# Patient Record
Sex: Female | Born: 1985 | Race: Black or African American | Hispanic: No | Marital: Single | Smoking: Current some day smoker
Health system: Southern US, Community
[De-identification: ages and names within clinical notes are randomized; demographics above are authoritative.]

## PROBLEM LIST (undated history)

## (undated) ENCOUNTER — Ambulatory Visit (HOSPITAL_COMMUNITY): Admission: EM | Payer: Medicare Other

## (undated) DIAGNOSIS — J40 Bronchitis, not specified as acute or chronic: Secondary | ICD-10-CM

## (undated) DIAGNOSIS — Q738 Other reduction defects of unspecified limb(s): Secondary | ICD-10-CM

## (undated) DIAGNOSIS — M1990 Unspecified osteoarthritis, unspecified site: Secondary | ICD-10-CM

## (undated) HISTORY — PX: HIP SURGERY: SHX245

## (undated) HISTORY — PX: VAGINA SURGERY: SHX829

## (undated) HISTORY — PX: OTHER SURGICAL HISTORY: SHX169

---
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
## 2003-09-12 ENCOUNTER — Emergency Department (HOSPITAL_COMMUNITY): Admission: EM | Admit: 2003-09-12 | Discharge: 2003-09-12 | Payer: Self-pay

## 2004-06-22 ENCOUNTER — Emergency Department (HOSPITAL_COMMUNITY): Admission: EM | Admit: 2004-06-22 | Discharge: 2004-06-22 | Payer: Self-pay | Admitting: Family Medicine

## 2004-06-25 ENCOUNTER — Emergency Department (HOSPITAL_COMMUNITY): Admission: EM | Admit: 2004-06-25 | Discharge: 2004-06-25 | Payer: Self-pay

## 2004-07-20 ENCOUNTER — Emergency Department (HOSPITAL_COMMUNITY): Admission: EM | Admit: 2004-07-20 | Discharge: 2004-07-20 | Payer: Self-pay | Admitting: Emergency Medicine

## 2004-08-08 ENCOUNTER — Emergency Department (HOSPITAL_COMMUNITY): Admission: EM | Admit: 2004-08-08 | Discharge: 2004-08-08 | Payer: Self-pay | Admitting: Family Medicine

## 2004-09-13 ENCOUNTER — Inpatient Hospital Stay (HOSPITAL_COMMUNITY): Admission: AD | Admit: 2004-09-13 | Discharge: 2004-09-19 | Payer: Self-pay | Admitting: Psychiatry

## 2004-09-13 ENCOUNTER — Ambulatory Visit: Payer: Self-pay | Admitting: Psychiatry

## 2004-10-26 ENCOUNTER — Emergency Department (HOSPITAL_COMMUNITY): Admission: EM | Admit: 2004-10-26 | Discharge: 2004-10-26 | Payer: Self-pay | Admitting: Emergency Medicine

## 2004-11-16 ENCOUNTER — Emergency Department (HOSPITAL_COMMUNITY): Admission: EM | Admit: 2004-11-16 | Discharge: 2004-11-16 | Payer: Self-pay | Admitting: Emergency Medicine

## 2005-02-04 ENCOUNTER — Emergency Department (HOSPITAL_COMMUNITY): Admission: EM | Admit: 2005-02-04 | Discharge: 2005-02-04 | Payer: Self-pay | Admitting: Emergency Medicine

## 2005-03-15 ENCOUNTER — Emergency Department (HOSPITAL_COMMUNITY): Admission: EM | Admit: 2005-03-15 | Discharge: 2005-03-15 | Payer: Self-pay | Admitting: Emergency Medicine

## 2005-03-25 ENCOUNTER — Emergency Department (HOSPITAL_COMMUNITY): Admission: EM | Admit: 2005-03-25 | Discharge: 2005-03-26 | Payer: Self-pay | Admitting: Emergency Medicine

## 2005-03-31 ENCOUNTER — Emergency Department (HOSPITAL_COMMUNITY): Admission: EM | Admit: 2005-03-31 | Discharge: 2005-03-31 | Payer: Self-pay | Admitting: Emergency Medicine
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
## 2005-04-10 ENCOUNTER — Emergency Department (HOSPITAL_COMMUNITY): Admission: EM | Admit: 2005-04-10 | Discharge: 2005-04-11 | Payer: Self-pay | Admitting: Emergency Medicine

## 2005-07-14 ENCOUNTER — Emergency Department (HOSPITAL_COMMUNITY): Admission: EM | Admit: 2005-07-14 | Discharge: 2005-07-14 | Payer: Self-pay | Admitting: Emergency Medicine

## 2005-07-22 ENCOUNTER — Emergency Department (HOSPITAL_COMMUNITY): Admission: EM | Admit: 2005-07-22 | Discharge: 2005-07-22 | Payer: Self-pay | Admitting: Emergency Medicine

## 2005-08-20 ENCOUNTER — Emergency Department (HOSPITAL_COMMUNITY): Admission: EM | Admit: 2005-08-20 | Discharge: 2005-08-20 | Payer: Self-pay | Admitting: Emergency Medicine

## 2005-08-26 ENCOUNTER — Encounter: Admission: RE | Admit: 2005-08-26 | Discharge: 2005-10-31 | Payer: Self-pay | Admitting: Orthopaedic Surgery

## 2005-08-27 ENCOUNTER — Emergency Department (HOSPITAL_COMMUNITY): Admission: EM | Admit: 2005-08-27 | Discharge: 2005-08-27 | Payer: Self-pay | Admitting: Emergency Medicine

## 2005-08-31 ENCOUNTER — Emergency Department (HOSPITAL_COMMUNITY): Admission: EM | Admit: 2005-08-31 | Discharge: 2005-08-31 | Payer: Self-pay | Admitting: Emergency Medicine

## 2005-09-02 ENCOUNTER — Inpatient Hospital Stay (HOSPITAL_COMMUNITY): Admission: RE | Admit: 2005-09-02 | Discharge: 2005-09-05 | Payer: Self-pay | Admitting: Psychiatry

## 2005-09-03 ENCOUNTER — Ambulatory Visit: Payer: Self-pay | Admitting: Psychiatry

## 2005-11-16 ENCOUNTER — Emergency Department (HOSPITAL_COMMUNITY): Admission: EM | Admit: 2005-11-16 | Discharge: 2005-11-16 | Payer: Self-pay | Admitting: *Deleted

## 2006-03-06 ENCOUNTER — Emergency Department (HOSPITAL_COMMUNITY): Admission: EM | Admit: 2006-03-06 | Discharge: 2006-03-06 | Payer: Self-pay | Admitting: Emergency Medicine

## 2006-03-09 ENCOUNTER — Inpatient Hospital Stay (HOSPITAL_COMMUNITY): Admission: AD | Admit: 2006-03-09 | Discharge: 2006-03-09 | Payer: Self-pay | Admitting: Obstetrics

## 2006-04-25 ENCOUNTER — Inpatient Hospital Stay (HOSPITAL_COMMUNITY): Admission: AD | Admit: 2006-04-25 | Discharge: 2006-04-25 | Payer: Self-pay | Admitting: Obstetrics

## 2006-05-11 ENCOUNTER — Emergency Department (HOSPITAL_COMMUNITY): Admission: EM | Admit: 2006-05-11 | Discharge: 2006-05-11 | Payer: Self-pay | Admitting: Emergency Medicine

## 2006-05-15 ENCOUNTER — Emergency Department (HOSPITAL_COMMUNITY): Admission: EM | Admit: 2006-05-15 | Discharge: 2006-05-15 | Payer: Self-pay | Admitting: Emergency Medicine

## 2006-07-04 ENCOUNTER — Inpatient Hospital Stay (HOSPITAL_COMMUNITY): Admission: AD | Admit: 2006-07-04 | Discharge: 2006-07-04 | Payer: Self-pay | Admitting: Obstetrics

## 2006-09-01 ENCOUNTER — Inpatient Hospital Stay (HOSPITAL_COMMUNITY): Admission: AD | Admit: 2006-09-01 | Discharge: 2006-09-01 | Payer: Self-pay | Admitting: Obstetrics

## 2006-09-05 ENCOUNTER — Inpatient Hospital Stay (HOSPITAL_COMMUNITY): Admission: AD | Admit: 2006-09-05 | Discharge: 2006-09-05 | Payer: Self-pay | Admitting: Obstetrics

## 2006-10-22 ENCOUNTER — Inpatient Hospital Stay (HOSPITAL_COMMUNITY): Admission: AD | Admit: 2006-10-22 | Discharge: 2006-10-22 | Payer: Self-pay | Admitting: Obstetrics

## 2006-10-28 ENCOUNTER — Inpatient Hospital Stay (HOSPITAL_COMMUNITY): Admission: AD | Admit: 2006-10-28 | Discharge: 2006-10-31 | Payer: Self-pay | Admitting: Obstetrics

## 2007-01-02 ENCOUNTER — Emergency Department (HOSPITAL_COMMUNITY): Admission: EM | Admit: 2007-01-02 | Discharge: 2007-01-02 | Payer: Self-pay | Admitting: Family Medicine

## 2007-01-10 ENCOUNTER — Emergency Department (HOSPITAL_COMMUNITY): Admission: EM | Admit: 2007-01-10 | Discharge: 2007-01-11 | Payer: Self-pay | Admitting: Emergency Medicine

## 2007-01-12 ENCOUNTER — Emergency Department (HOSPITAL_COMMUNITY): Admission: EM | Admit: 2007-01-12 | Discharge: 2007-01-12 | Payer: Self-pay | Admitting: Family Medicine

## 2007-03-13 ENCOUNTER — Inpatient Hospital Stay (HOSPITAL_COMMUNITY): Admission: AD | Admit: 2007-03-13 | Discharge: 2007-03-13 | Payer: Self-pay | Admitting: Obstetrics

## 2007-05-06 ENCOUNTER — Emergency Department (HOSPITAL_COMMUNITY): Admission: EM | Admit: 2007-05-06 | Discharge: 2007-05-06 | Payer: Self-pay | Admitting: Family Medicine

## 2007-08-07 ENCOUNTER — Emergency Department (HOSPITAL_COMMUNITY): Admission: EM | Admit: 2007-08-07 | Discharge: 2007-08-07 | Payer: Self-pay | Admitting: Emergency Medicine

## 2007-12-24 ENCOUNTER — Emergency Department (HOSPITAL_COMMUNITY): Admission: EM | Admit: 2007-12-24 | Discharge: 2007-12-24 | Payer: Self-pay | Admitting: Emergency Medicine

## 2007-12-26 ENCOUNTER — Emergency Department (HOSPITAL_COMMUNITY): Admission: EM | Admit: 2007-12-26 | Discharge: 2007-12-26 | Payer: Self-pay | Admitting: Emergency Medicine

## 2008-07-24 ENCOUNTER — Emergency Department (HOSPITAL_COMMUNITY): Admission: EM | Admit: 2008-07-24 | Discharge: 2008-07-24 | Payer: Self-pay | Admitting: Emergency Medicine

## 2008-08-16 ENCOUNTER — Emergency Department (HOSPITAL_COMMUNITY): Admission: EM | Admit: 2008-08-16 | Discharge: 2008-08-16 | Payer: Self-pay | Admitting: Emergency Medicine

## 2008-08-17 ENCOUNTER — Emergency Department (HOSPITAL_COMMUNITY): Admission: EM | Admit: 2008-08-17 | Discharge: 2008-08-17 | Payer: Self-pay | Admitting: Emergency Medicine

## 2010-02-13 ENCOUNTER — Emergency Department (HOSPITAL_COMMUNITY): Admission: EM | Admit: 2010-02-13 | Discharge: 2010-02-13 | Payer: Self-pay | Admitting: Family Medicine

## 2010-12-03 LAB — GC/CHLAMYDIA PROBE AMP, GENITAL
Chlamydia, DNA Probe: NEGATIVE
GC Probe Amp, Genital: NEGATIVE

## 2010-12-03 LAB — POCT RAPID STREP A (OFFICE): Streptococcus, Group A Screen (Direct): POSITIVE — AB

## 2011-02-01 NOTE — Op Note (Signed)
NAME:  Carol Potter, Carol Potter              ACCOUNT NO.:  [id number]   MEDICAL RECORD NO.:  [id number]          PATIENT TYPE:  INP   LOCATION:  9101                          FACILITY:  [facility]   PHYSICIAN:  Kathreen Cosier, M.D.DATE OF BIRTH:  1986/09/13   DATE OF PROCEDURE:  10/28/2006  DATE OF DISCHARGE:                               OPERATIVE REPORT   PREOPERATIVE DIAGNOSIS:  Failure to progress in labor.   SURGEON:  Dr. Gaynell Face   ANESTHESIA:  Epidural.   PROCEDURE:  The patient placed on the operating table in supine  position.  Abdomen prepped and draped, bladder emptied with Foley  catheter.  Transverse suprapubic incision made carried down to rectus  fascia. Fascia cleaned and incised length of the incision.  Recti  muscles retracted laterally.  Peritoneum incised longitudinally.  Transverse incision made in the visceral peritoneum above the bladder.  Bladder mobilized inferiorly.  The transverse uterine incision made.  The patient delivered from the OP position of female, Apgars 9 and 9  weighing 7 pounds 1 ounce.  Team was in attendance.  Fluid was clear.  Placenta was anterior, fundal and removed manually, sent to labor and  the uterine cavity cleaned with dry laps.  Uterine incision closed in  one layer with continuous suture of #1 chromic.  Hemostasis  satisfactory. Bladder flap reattached with 2-0 chromic. The uterus was  contracted.  Tubes and ovaries normal.  Abdomen closed in layers.  Peritoneum continuous suture of 0 chromic, the fascia continuous suture  of 0 Dexon and the skin closed with subcuticular stitch of 4-0 Monocryl.  Blood loss 500 mL.           ______________________________  Kathreen Cosier, M.D.     BAM/MEDQ  D:  10/28/2006  T:  10/29/2006  Job:  [id number]

## 2011-02-01 NOTE — H&P (Signed)
[name]  Patient:    Carol Potter                        MRN: [id number] Adm. Date:  [id number] Disc. Date: [id number] Attending:  Koren Shiver Ock                         History and Physical  CHIEF COMPLAINT:  Painful feet.  HISTORY OF PRESENT ILLNESS:  This is a 25 year old female who presented into the office accompanied by her mother, complaining of pain on both feet upon ambulation, on running or weightbearing.  The patient stated that her problem has been going on for several years.  Recently pain in both feet has become more severe, intense, and more frequent.  The patient is also complaining of a collapsing arch and overlapping toes on the right foot.  At this time the patient and her mother request definitive procedures done to her feet in order to correct the deformity as well as to correct the pain.  PAST MEDICAL HISTORY:  Reveals she is being treated for asthma.  PAST SURGICAL HISTORY:  None revealed.  CURRENT MEDICATIONS:  None revealed.  ALLERGIES:  No known drug allergies.  FAMILY HISTORY/SOCIAL HISTORY:  This is a female student who lives with her mother, and is very anxious to have her feet corrected.  REVIEW OF SYSTEMS:  Deferred to her primary care physician, where she went to have a preoperative physical examination done, to get approval for this proposed surgery.  PODIATRIC EXAMINATION:  Vascular status of the lower limbs is within normal limits. All pedal pulses are palpable.   Dermatologically she has normotrophic skin with the exception of the right foot in the interdigital spaces which had a large degree of tinea pedis.  ORTHOPEDIC EXAMINATION:  Reveals she has a deviated first metatarsal phalangeal  joint which deviates dorsal medially and has a severe deviation of the hallux towards the lateral direction.  Both feet have flattened arches, with a severely elevated first metatarsal shaft upon  weightbearing, and there is a significant forefoot abductus on transverse plane.  NEUROLOGIC:  Reveals all ______ and epicritic sensations are grossly intact. Biomechanical examination revealed she has a pes planus and elevated first metatarsal shaft and forefoot varus when the rearfoot was positioned in neutral. Upon loading of the forefoot, the forefoot has abductus position and her subtalar joint pronates significantly at its medial ______.  The medial longitudinal arch is lower and has reducible flexibility upon manipulation.  The pes planus deformities were reducible about 90% upon manipulation.  Her ankle joint dorsiflexion was over 10 degrees, when the knee was extended, and flexed on both feet.  RADIOGRAPHIC EXAMINATION:  Revealed she has laterally deviated hallux with medially deviated first metatarsal and increased first intermetatarsal spaces.  There is a low calcaneal inclination angle with increased talar declination angle.  There s an anterior break on cyma line from the anteriorly advanced talus on both feet.  There is accessibility of her pronation which was evidenced on the lateral view of the x-ray.  There was the presence of decreased lateral calcaneal cuboid angles  seen on both feet, through the AP view.  IMPRESSION: 1. Symptomatic flexible pes planus bilateral, right more than left. 2. Bilateral bunion deformity.  PLANNED COURSE OF ACTION:  The patient was reviewed of all available treatment options to her, and to her mother, including change of shoe gear,  orthotic therapy, as well as the surgical interventions.  At this time the patient and the mother  seem to understand the explained foot problem, and both request surgical intervention at this time.  Therefore a surgical procedure was proposed which was a Cotton osteotomy with bone graft and an Evans calcaneal osteotomy with bone graft. At this time the patient and her mother signed a consent form,  and the surgery as scheduled at the day surgery center.  On the morning of the surgery the vital signs and physical examination were conducted and found no contraindications for the proposed surgery.  DD:  10/14/99 TD:  10/14/99 Job: [id number]

## 2011-02-01 NOTE — H&P (Signed)
NAME:  Carol Potter, Carol Potter              ACCOUNT NO.:  [id number]   MEDICAL RECORD NO.:  [id number]          PATIENT TYPE:  IPS   LOCATION:  0404                          [facility]ILITY[facility]  [facility]   PHYSICIAN:  Jeanice Lim, M.D. DATE OF BIRTH:  1985-11-12   DATE OF ADMISSION:  09/13/2004  DATE OF DISCHARGE:                         PSYCHIATRIC ADMISSION ASSESSMENT   This 25 year old, single, African-American female was involuntarily  commitment on September 13, 2004.   HISTORY OF PRESENT ILLNESS:  Patient is here on petition papers.  The  patient is very fearful, experiencing positive visual hallucinations.  She  has been locking herself in the room. The patient apparently was found at  home with her mother, who had been dead for the past several days.  No other  history, at this time, is available.   PAST PSYCHIATRIC HISTORY:  This is the first admission to [facility].  The patient is a patient at [facility].  She  sees Dr. Lang Snow.   SOCIAL HISTORY:  A 25 year old single African-American female; apparently  was living with her mother in an apartment.   FAMILY HISTORY:  Family history is unclear.   ALCOHOL AND DRUG HISTORY:  There appears to be no apparent alcohol or drug  use.   PRIMARY CARE Terrah Decoster:  Unknown.   MEDICAL PROBLEMS:  Rheumatoid arthritis per chart.   CURRENT MEDICATIONS:  The patient has been on Lexapro 10 mg.   DRUG ALLERGIES:  No known allergies.   REVIEW OF SYSTEMS:  The patient provides a very poor review of systems. She  has some history of joint pain.   PHYSICAL EXAMINATION:  VITAL SIGNS:  Her temperature is 98.7, 63 heart rate,  18 respirations, blood pressure 122/71.  119 pounds.  GENERAL:  This is a young female who appears pretty healthy, fearful.  LUNGS:  Her respirations are easy.  EXTREMITIES:  The patient moves all extremities.  There is no clubbing or  swelling noted.  SKIN:  Warm and dry.   LABS:  Her CBC is within  normal limits.  Blood sugar is 107, BUN is 3; TSH  is 0.838, total cholesterol 2.5, triglycerides are 97, HDL is 51.   MENTAL STATUS EXAM:  An alert, young female.  She is in the bed trying to  keep her eyes open nodding her head to questions.  Her speech is barely  audible.  Thought processes:  The patient endorsing possible auditory and  visual hallucinations.  Cognitive is intact.  Judgment and insight is poor-  to-fair.  She is a poor historian.   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  AXIS II.  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  AXIS III.  Rheumatoid arthritis per chart.  AXIS IV.  Other psychiatric problems possibly medical problems.  AXIS V.  25 Current, past years 55-60 estimated.   PLAN:  A pleasant involuntarily commitment for a psychosis.  The patient  will be placed in __________ for close monitoring, contract for safety, will  stabilize __________, will initiate an antipsychotic and continue the  patient's antidepressants.  We  will monitor food and fluid intake.  The case  manager is to assess living situation and obtain any other information from  the support group.  The patient will followup with [facility] and be medication compliant.  __________ is 5-6 days.     Jani   JO/MEDQ  D:  09/19/2004  T:  09/19/2004  Job:  [id number]

## 2011-02-01 NOTE — H&P (Signed)
NAME:  Carol Potter, Carol Potter              ACCOUNT NO.:  [id number]   MEDICAL RECORD NO.:  [id number]           PATIENT TYPE:   LOCATION:                                [facility]ACILITY:  [facility]   PHYSICIAN:  Kathreen Cosier, M.D.   DATE OF BIRTH:   DATE OF ADMISSION:  10/28/2006  DATE OF DISCHARGE:                              HISTORY & PHYSICAL   HISTORY OF PRESENT ILLNESS:  The patient is a 25 year old primigravida,  [facility] November 01, 2006.  She was admitted in labor 3 cm, 100%, vertex, 0  to +1 station.  At 5 p.m. she was 7-8 cm, 100%, +1 station.  Membranes  erupted artificially.  She was in good labor and she was a negative GBS.   PAST MEDICAL HISTORY:  She has a history of arthritis.   She received Pitocin stimulation __________ , and by 10 p.m. her cervix  was unchanged.  It was decided she would deliver by C-section for  failure to progress in labor.   PHYSICAL EXAMINATION:  GENERAL:  Revealed well-developed female in  labor.  HEENT:  Negative.  LUNGS:  Clear.  HEART:  Regular rhythm.  No murmurs, no gallops.  BREASTS:  No masses.  ABDOMEN:  Term with estimated fetal weight of 7 pounds 2 ounces.  EXTREMITIES:  Negative.           ______________________________  Kathreen Cosier, M.D.     BAM/MEDQ  D:  10/28/2006  T:  10/29/2006  Job:  [id number]

## 2011-02-01 NOTE — Discharge Summary (Signed)
NAME:  Carol Potter, Carol Potter              ACCOUNT NO.:  [id number]   MEDICAL RECORD NO.:  [id number]          PATIENT TYPE:  IPS   LOCATION:  0503                          [facility]ILITY[facility]  [facility]   PHYSICIAN:  Anselm Jungling, MD  DATE OF BIRTH:  07/07/1986   DATE OF ADMISSION:  09/02/2005  DATE OF DISCHARGE:  09/05/2005                                 DISCHARGE SUMMARY   IDENTIFYING DATA/REASON FOR ADMISSION:  This was the first [facility] admission for  Carol Potter, a 25 year old single black female admitted on an involuntary basis  for increasing mood disturbance consisting of depression and irritability,  and psychotic symptoms consisting of auditory and visual hallucinations, in  association with daily marijuana use. She had previously been treated at  [facility] with Depakote and Risperdal, but the patient  stated that she stopped taking these on her own decision because I got  mad.  Please refer to the admission note for further details pertaining to  the symptoms, circumstances and history that led to her hospitalization.   MEDICAL/LABORATORY:  The patient was medically assessed by the psychiatric  nurse practitioner at the time of admission. There were no significant  medical issues during this brief inpatient psychiatric stay. She was given  Peridex mouth wash and Vicodin on a p.r.n. basis for oral pain. She was also  given her usual Claritin 10 mg daily for mild allergy symptoms.   HOSPITAL COURSE:  The patient was admitted to the adult inpatient  psychiatric service where she participated in various therapeutic groups and  activities. She was restarted on a regimen of Depakote ER 500 mg q.h.s. and  Risperdal 2 mg p.o. q.h.s. for presumed major depressive disorder with  psychotic features, rule out bipolar disorder, rule out schizophrenia. These  were generally well-tolerated. Although the patient was mildly sleepy after  receiving these medications she was up, dressed and  visible in the milieu.  Whereas initially, she had presented as being very irritable and guarded  without any eye contact, and making statements that she did not want to take  any more medication or receive inpatient treatment, she became more open,  tractable, and nonirritable. By the third hospital day, there were no overt  signs or symptoms of psychosis.   The patient continued to improve over the remainder of her stay. On the day  of discharge, the patient was denying hallucinations, was no longer  irritable, and was sleeping well. She indicated that she realized the need  to stay on her medications. Her boyfriend had indicated that he would help  her with medications and support her continued compliance with them.   AFTERCARE:  The patient was to follow-up at the [facility], with an  appointment with Dr. Lang Snow on September 12, 2005. She was also to follow-up  with Dr. Gwendlyn Deutscher, her medical physician, on September 13, 2005.   DISCHARGE MEDICATIONS:  1.  Depakote ER 500 mg q.h.s.  2.  Risperdal 2 mg M-Tab at bedtime.  3.  Peridex oral rinse twice daily.   DISCHARGE DIAGNOSES:  AXIS I:  Bipolar disorder not  otherwise specified.  AXIS II:  Deferred.  AXIS III:  No acute or chronic illnesses.  AXIS IV:  Stressors:  Severe.  AXIS V:  GAF on discharge 65.           ______________________________  Anselm Jungling, MD  Electronically Signed     SPB/MEDQ  D:  10/03/2005  T:  10/03/2005  Job:  [id number]

## 2011-02-01 NOTE — Procedures (Signed)
CLINICAL HISTORY:  The patient is a 25 year old with hallucinations that  occurred previous week. The patient has arthritis. Study is being done to  look for the presence of seizures.   PROCEDURE:  The tracing was carried out on a 32-channel digital Cadwell  recorder reformatted into 16-channel montages with 1 devoted to EKG. The  patient was awake during the recording. The [employer] 10/20 system lead  placement was used.   MEDICATIONS:  1.  Risperdal.  2.  Lexapro.  3.  Naproxen.  4.  Depakote.   DESCRIPTION OF FINDINGS:  Dominant frequency is 9 hertz, 25 to 30 microvolt  activity that is well regulated and attenuates partially with eye opening.   Background activity shows occasional theta and upper delta range activity  that is prominent with drowsiness but is seen also when in the waking  rhythm. There was no focal slowing. There was no intraictal epileptiform  activity in the form of spikes or sharp waves. Photic stimulation was not  carried. EKG showed a regular sinus rhythm with a ventricular response of 78  beats per minute.   IMPRESSION:  Normal record with the patient awake and drowsy.     Will   ZOX:WRUE  D:  09/19/2004 04:20:47  T:  09/19/2004 07:42:35  Job #:  [id number]   cc:   [facility]

## 2011-02-01 NOTE — Discharge Summary (Signed)
NAME:  Carol Potter, Carol Potter              ACCOUNT NO.:  [id number]   MEDICAL RECORD NO.:  [id number]          PATIENT TYPE:  INP   LOCATION:  9117                          [facility]ILITY:  [facility]   PHYSICIAN:  Kathreen Cosier, M.D.DATE OF BIRTH:  [birth year]   DATE OF ADMISSION:  10/28/2006  DATE OF DISCHARGE:  10/31/2006                               DISCHARGE SUMMARY   The patient is a 25 year old, gravida 1, ED[facility] November 01, 2006, admitted  in labor at 7-8 cm, 100%, +1 station.  Membranes ruptured artificially,  and fluid was meconium stained.  The patient was in adequate labor for 5  hours, and there was no change in the cervix.  She was delivered by C  section for failure to progress.  She had a female, Apgars 9/9 from the  OP position, weighing 7 pounds 1 ounce.   Postoperatively she did well.  On admission, her hemoglobin was 12.1,  10.5 postoperatively.  Platelets 208, 175.  Sodium 136, potassium 3.9,  chloride 108, creatinine 0.69.  HIV negative.  The patient was  discharged on the third postoperative day, ambulatory, on a regular diet  on Tylox 1-2 every 3-4 hours p.r.n. pain.   DISCHARGE DIAGNOSES:  Status post primary low transverse cesarean  section at term for failure to progress in labor.           ______________________________  Kathreen Cosier, M.D.     BAM/MEDQ  D:  11/19/2006  T:  11/19/2006  Job:  [id number]

## 2011-02-01 NOTE — Op Note (Signed)
[place]  Patient:    Carol Potter                        MRN: [id number] Proc. Date: 10/08/99 Adm. Date:  [id number] Disc. Date: [id number] Attending:  Koren Shiver Ock                           Operative Report  SURGEON:  Myeong O. Sheard, D.P.M.  ASSISTANT SURGEON:  Alvan Dame, D.P.M.  PREOPERATIVE DIAGNOSES: 1. Abnormal osseous development of the calcaneus, left foot. 2. Subluxed navicular cuneiform joint of the right foot.  Surgery done    on the right foot only.  POSTOPERATIVE DIAGNOSES: 1. Abnormal osseous development of the calcaneus, left foot. 2. Subluxed navicular cuneiform joint of the right foot.  Surgery done    on the right foot only.  OPERATION: 1. Cotton osteotomy with bone graft, right foot. 2. Evans calcaneal osteotomy with bone graft, right foot.  ANESTHESIA:  Given by the anesthesia team, and also local infiltration of a 50:50 mixture, 1% Xylocaine plain, and 0.5% Marcaine plain to the surgical field. Hemostasis was achieved by a pneumatic ankle tourniquet.  INDICATIONS:  This is a 25 year old female who has been having chronic foot pain due to prolapsing flat foot, as well as severe rearfoot pronation, and abnormal  forefoot development which caused increasing foot pain upon ambulation and weightbearing and running.  She has a loss of medial longitudinal arch, and has  excessive secondary changes on the radiographic examination, due to the pronated foot.  Clinically the patient shows a deviated and subluxed first metatarsal phalangeal joint as well as loss of the medial longitudinal arch, plus the patient reveals lateral ankle pain upon activity.  INTRAOPERATIVE FINDINGS: Revealed normal osseous and soft tissue structures, other than the structural abnormalities that were mentioned above.  All other findings were consistent with the age and the sex of the patient.  DESCRIPTION OF PROCEDURE:  The  patient was brought to the operating room and placed supine.  The patient was given IV and induced general anesthesia, and also given 1 g of Ancef for prophylaxis, and a local infiltration of the prepared anesthetic  agents were infiltrated into the surgical operative site, which was just anterior to the right ankle, medial column, and lateral column dorsally and plantarly. he right foot was then prepped and draped in the usual sterile manner, and the following procedure was performed:  1. Evans calcaneal osteotomy with bone graft, right foot:  The attention was directed to the anterior and lateral aspect of the lateral malleoli just inferior to the lateral malleoli of the right foot.  The inferior tip the lateral malleoli and the dorsal lateral base of the fifth metatarsal bone was palpated.  The course of the peroneus brevis tendon and the extensor distal tendon was also palpated.  The line was drawn from midpoint from the inferior tip of the lateral malleoli and plantar lateral weightbearing surface of the right foot.  planned incision was then drawn and was about 3.0 cm inferior and just anterior to the tip of the lateral malleoli, and advanced distally parallel to the direction of the anterior body of the calcaneus.  This incision was ended at the level of the calcaneal cuboid joint, and was about 6.0 to 7.0 cm long.  After the planned incision was drawn with the marking pen, and the incision was placed parallel and  superior to the anconeus brevis tendon.  The incision was deepened in the same plane with care being paid to the crossing neurovascular structures.  A crossing sural nerve was encountered which was retracted with a Vesi-loop dorsally, and lso the the peroneal tendon was identified, which was also retracted dorsally. Sharp and blunt soft tissue dissection was carried out and the periosteal tissue was identified.  The capsular structures were identified and a  capsular incision was placed at the same position as the skin incision, and the periosteal soft tissues were reflected dorsally and plantarly.  At this time the anterior body of the calcaneus anterolateral body of the calcaneus was exposed.  The radiographic examination was done in the operating room.  The sites were confirmed and decided. The osteotomy was then carried out at about 1.0 cm proximal from the calcaneal cuboid joint, which was directed from lateral to medial, and was also parallel o the calcaneal cuboid joint space.  This osteotomy was carried in such a way as o maintain the base hinge at the medial border of the calcaneus.  After the osteotomy was cut, it was pried open, to perform the wedge shape to create a wedge-shaped  opening at the lateral aspect of the calcaneus.  This pried-open wedge was about 1.2 cm wide.  A gap was created utilizing an osteotome.  At this time the freeze-dried bone was prepared which was tricortical bone from the iliac crest n nature, which was cut and made an adjustment to make fit to this opening.  The wo pieces of bone were prepared, which was about triangular-shaped, and these two pieces of bone were grafted into this wedge-shaped opening.  The graft to the bone was about 1.1 cm wide from proximal to distal width, and from lateral to medial it was about 2.0 cm long.  From the lateral surface to the medial surface it of the gap was about 2.5 cm deep.  After this wedge-shaped bone graft was completed, this wedge-shaped bone was impacted into this created gap.  Once the two pieces of bone graft were placed through this osteotomy site, the area was found to be stable,  which was also evaluated utilizing intraoperative x-ray, and the findings were satisfactory, and the position was also satisfactory.  The foot was then found hat lateral deviation was reduced through this procedure.  Therefore this area was well-flushed and anatomical  closure was carried out with a #3-0 Dexon for the underlying capsule, and #4-0 Dexon for the subcutaneous tissue, and #5-0 Dexon or the skin closure.  2. Cotton osteotomy with bone graft, right foot:   Attention was then put to the dorsum of the foot.  A linear skin incision was placed centrally from the level of the navicular cuneiform joint, and extended distally to the level of the first metatarsal cuneiform joint.  This incision was about 4.0 cm long, and deepened in a chronic ambulatory peritoneal dialysis rotation ______ to the vital structures.  After the capsular structures were identified, a capsular incision was placed over the dorsum of the first cuneiform in the same direction as the skin incision.  The periosteal structures were reflected and the first cuneiform bone was exposed at its dorsal surface.  At this time the intraoperative x-rays were also taken and identified the location of the osteotomy. Utilizing a power saw the osteotomy was placed from plantar to dorsal, maintaining the plantar at hinge.  This cut was also patterned to the joint surface of the midpoint  of the cuneiform bone.  This cut was about 1.5 cm proximal to the first metatarsal cuneiform joint.  After this osteotomy was done, this area was  pried open utilizing an osteotome, and created about a 1.0 cm wide gap.  This wedge-shaped gap was then inserted with the preprepared wedge-shaped bone graft  which was prepared prior to the osteotomy, and was about 1.0 cm wide and this tri-bone which was tricortical iliac bone was inserted into this prepared gap. The area was examined and found to be stable at the graft site.  At this time the foot was evaluated and found that the medial column produced a fair degree of plantar flexion through this procedure.  This surgical site was then again evaluated, utilizing an x-ray, which was found to be satisfactory.  Therefore this area was well-flushed and  prepared with the prepared irrigation solution, and the closure was carried out with #3-0, #4-0, and #5-0 Dexon.  Upon completion of this procedure, 1 cc of dexamethasone phosphate was infiltrated into the surgical site. The patient appeared to tolerate this surgery and anesthesia well.  After the dressing was applied to the right foot, the below-the-knee nonweightbearing fiberglass cast was also applied to the right foot and leg.  Upon completion of  applying this nonweightbearing fiberglass cast, the patient was then transferred to the recovery room in satisfactory condition, with stable vital signs.  DISPOSITION:  The patient and the mother were given postoperative instructions or care at home, and also postoperative pain medications with Darvocet and Anaprox. The patient also has an appointment to return to the office in one week. DD:  10/14/99 TD:  10/14/99 Job: [id number]

## 2011-02-01 NOTE — Discharge Summary (Signed)
NAME:  Carol Potter, Carol Potter              ACCOUNT NO.:  [id number]   MEDICAL RECORD NO.:  [id number]          PATIENT TYPE:  IPS   LOCATION:  0404                          FACILITY:  [facility]   PHYSICIAN:  Jeanice Lim, M.D. DATE OF BIRTH:  08/24/1986   DATE OF ADMISSION:  09/13/2004  DATE OF DISCHARGE:  09/19/2004                                 DISCHARGE SUMMARY   IDENTIFYING DATA:  This is a 25 year old African-American female voluntarily  admitted.  Presenting with a history of feeling scared, having visual  hallucinations, locked herself in her room, apparently found at home with  her mother who had been dead for the past several days.  The patient was at  [facility].  Sees Dr. Lang Snow.  The patient had  been living with her mother in an apartment.  No alcohol or drug use.   MEDICATIONS:  The patient had been on Lexapro in the past.   ALLERGIES:  No known drug allergies.   PHYSICAL EXAMINATION:  Physical and neurological exam within normal limits.   LABORATORY DATA:  Routine admission labs essentially within normal limits  including TSH and lipid panel.   MENTAL STATUS EXAM:  Alert, young female in bed.  Trying to keep her eyes  open, nodding her head to questions.  Endorsing possible auditory or visual  hallucinations.  Cognitively intact.  Judgment and insight were limited.  She was a poor historian.   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   AXIS II:  Mild mental retardation.   AXIS III:  Rheumatoid arthritis.   AXIS IV:  Psychiatric problems related to possible medical problems.   AXIS V:  25/55.   HOSPITAL COURSE:  The patient was admitted and ordered routine p.r.n.  medications and underwent further monitoring.  Was encouraged to participate  in individual, group and milieu therapy.  The patient was initiated on an  antipsychotic for hallucinations and medication to target depressive  symptoms as  well as rule out a reversible medical etiology of her  psychopathology.  The patient reported tolerating medications as support  system was mobilized as well as community resources.   CONDITION ON DISCHARGE:  The patient was discharged in improved condition.  Mood was much more euthymic and stable.  Affect brighter.  Thought processes  goal directed.  The patient appeared to be using healthier coping skills and  had significantly improved judgment and insight.  Neurologic workup  including head CT and EEG were obtained and patient was discharged, again,  in improved condition after medication education.   DISCHARGE MEDICATIONS:  1.  Naprosyn 500 mg twice a day.  2.  Risperdal 1 mg, 1/2 in the morning and 2 tabs at night.  3.  Lexapro 10 mg, 1/2 q.a.m.  4.  Ambien 10 mg at 9 p.m.  5.  Trazodone 100 mg at 9 p.m.  6.  Valproic acid 250 mg, 3 caps at 9 p.m.  7.  Saline nasal spray 2-4 times a day p.r.n. dryness.   FOLLOW UP:  The patient was to  follow up with Dr. Caralee Ates on Friday at  11:30 and to follow up regarding joint pain.  The patient was to see Dr.  Allyne Gee on Friday on January 6th at 1:30 p.m. at [facility].   Family services had been contacted in order to assist patient with  outpatient needs.   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   AXIS II:  Mild mental retardation.   AXIS III:  Rheumatoid arthritis.   AXIS IV:  Psychiatric problems related to possible medical problems.   AXIS V:  Global Assessment of Functioning on discharge 50-55.      JEM/MEDQ  D:  10/14/2004  T:  10/14/2004  Job:  [id number]

## 2011-06-11 LAB — CULTURE, ROUTINE-ABSCESS: Gram Stain: NONE SEEN

## 2011-06-21 LAB — URINALYSIS, ROUTINE W REFLEX MICROSCOPIC
Bilirubin Urine: NEGATIVE
Glucose, UA: NEGATIVE mg/dL
Hgb urine dipstick: NEGATIVE
Ketones, ur: NEGATIVE mg/dL
Nitrite: NEGATIVE
Protein, ur: NEGATIVE mg/dL
Specific Gravity, Urine: 1.016 (ref 1.005–1.030)
Urobilinogen, UA: 1 mg/dL (ref 0.0–1.0)
pH: 7.5 (ref 5.0–8.0)

## 2011-06-21 LAB — DIFFERENTIAL
Basophils Absolute: 0 10*3/uL (ref 0.0–0.1)
Basophils Relative: 0 % (ref 0–1)
Eosinophils Absolute: 0 10*3/uL (ref 0.0–0.7)
Eosinophils Relative: 1 % (ref 0–5)
Lymphocytes Relative: 39 % (ref 12–46)
Lymphs Abs: 1.5 10*3/uL (ref 0.7–4.0)
Monocytes Absolute: 0.3 10*3/uL (ref 0.1–1.0)
Monocytes Relative: 6 % (ref 3–12)
Neutro Abs: 2.1 10*3/uL (ref 1.7–7.7)
Neutrophils Relative %: 54 % (ref 43–77)

## 2011-06-21 LAB — POCT I-STAT, CHEM 8
BUN: 10 mg/dL (ref 6–23)
Calcium, Ion: 1.21 mmol/L (ref 1.12–1.32)
Chloride: 106 mEq/L (ref 96–112)
Creatinine, Ser: 0.8 mg/dL (ref 0.4–1.2)
Glucose, Bld: 106 mg/dL — ABNORMAL HIGH (ref 70–99)
HCT: 42 % (ref 36.0–46.0)
Hemoglobin: 14.3 g/dL (ref 12.0–15.0)
Potassium: 4.3 mEq/L (ref 3.5–5.1)
Sodium: 141 mEq/L (ref 135–145)
TCO2: 25 mmol/L (ref 0–100)

## 2011-06-21 LAB — CBC
HCT: 40.4 % (ref 36.0–46.0)
Hemoglobin: 13.3 g/dL (ref 12.0–15.0)
MCHC: 33 g/dL (ref 30.0–36.0)
MCV: 84.3 fL (ref 78.0–100.0)
Platelets: 253 10*3/uL (ref 150–400)
RBC: 4.8 MIL/uL (ref 3.87–5.11)
RDW: 13.8 % (ref 11.5–15.5)
WBC: 3.9 10*3/uL — ABNORMAL LOW (ref 4.0–10.5)

## 2011-06-21 LAB — PREGNANCY, URINE: Preg Test, Ur: NEGATIVE

## 2011-07-03 LAB — CBC
HCT: 39
MCV: 77.8 — ABNORMAL LOW
RBC: 5.02
WBC: 6.6

## 2011-07-03 LAB — GC/CHLAMYDIA PROBE AMP, GENITAL
Chlamydia, DNA Probe: NEGATIVE
GC Probe Amp, Genital: NEGATIVE

## 2019-06-15 ENCOUNTER — Other Ambulatory Visit: Payer: Self-pay

## 2019-06-15 ENCOUNTER — Encounter (HOSPITAL_COMMUNITY): Payer: Self-pay

## 2019-06-15 ENCOUNTER — Emergency Department (HOSPITAL_COMMUNITY)
Admission: EM | Admit: 2019-06-15 | Discharge: 2019-06-15 | Disposition: A | Discharge status: Home / Self Care | Payer: Medicare Other | Attending: Emergency Medicine | Admitting: Emergency Medicine

## 2019-06-15 DIAGNOSIS — R102 Pelvic and perineal pain: Secondary | ICD-10-CM | POA: Diagnosis not present

## 2019-06-15 DIAGNOSIS — A599 Trichomoniasis, unspecified: Secondary | ICD-10-CM

## 2019-06-15 DIAGNOSIS — A601 Herpesviral infection of perianal skin and rectum: Secondary | ICD-10-CM | POA: Diagnosis not present

## 2019-06-15 DIAGNOSIS — N730 Acute parametritis and pelvic cellulitis: Secondary | ICD-10-CM

## 2019-06-15 DIAGNOSIS — Z202 Contact with and (suspected) exposure to infections with a predominantly sexual mode of transmission: Secondary | ICD-10-CM | POA: Diagnosis present

## 2019-06-15 DIAGNOSIS — R10815 Periumbilic abdominal tenderness: Secondary | ICD-10-CM | POA: Insufficient documentation

## 2019-06-15 DIAGNOSIS — N739 Female pelvic inflammatory disease, unspecified: Secondary | ICD-10-CM | POA: Insufficient documentation

## 2019-06-15 HISTORY — DX: Other reduction defects of unspecified limb(s): Q73.8

## 2019-06-15 HISTORY — DX: Bronchitis, not specified as acute or chronic: J40

## 2019-06-15 LAB — URINALYSIS, ROUTINE W REFLEX MICROSCOPIC
Bilirubin Urine: NEGATIVE
Glucose, UA: NEGATIVE mg/dL
Hgb urine dipstick: NEGATIVE
Ketones, ur: NEGATIVE mg/dL
Leukocytes,Ua: NEGATIVE
Nitrite: NEGATIVE
Protein, ur: NEGATIVE mg/dL
Specific Gravity, Urine: 1.009 (ref 1.005–1.030)
pH: 6 (ref 5.0–8.0)

## 2019-06-15 LAB — HIV ANTIBODY (ROUTINE TESTING W REFLEX): HIV Screen 4th Generation wRfx: NONREACTIVE

## 2019-06-15 LAB — WET PREP, GENITAL
Clue Cells Wet Prep HPF POC: NONE SEEN
Sperm: NONE SEEN
Yeast Wet Prep HPF POC: NONE SEEN

## 2019-06-15 LAB — POC URINE PREG, ED: Preg Test, Ur: NEGATIVE

## 2019-06-15 MED ORDER — ACYCLOVIR 400 MG PO TABS: 400.0000 mg | ORAL_TABLET | Freq: Four times a day (QID) | ORAL | 0 refills | Status: DC

## 2019-06-15 MED ORDER — FLUCONAZOLE 150 MG PO TABS
150.0000 mg | ORAL_TABLET | Freq: Once | ORAL | Status: AC
Start: 2019-06-15 — End: 2019-06-15
  Administered 2019-06-15: 150 mg via ORAL
  Filled 2019-06-15: qty 1

## 2019-06-15 MED ORDER — AZITHROMYCIN 250 MG PO TABS
1000.0000 mg | ORAL_TABLET | Freq: Once | ORAL | Status: AC
  Administered 2019-06-15: 1000 mg via ORAL
  Filled 2019-06-15: qty 4

## 2019-06-15 MED ORDER — ONDANSETRON 4 MG PO TBDP
4.0000 mg | ORAL_TABLET | Freq: Once | ORAL | Status: AC
  Administered 2019-06-15: 4 mg via ORAL
  Filled 2019-06-15: qty 1

## 2019-06-15 MED ORDER — DOXYCYCLINE HYCLATE 100 MG PO CAPS: 100.0000 mg | ORAL_CAPSULE | Freq: Two times a day (BID) | ORAL | 0 refills | Status: DC

## 2019-06-15 MED ORDER — CEFTRIAXONE SODIUM 250 MG IJ SOLR
250.0000 mg | Freq: Once | INTRAMUSCULAR | Status: AC
  Administered 2019-06-15: 250 mg via INTRAMUSCULAR
  Filled 2019-06-15: qty 250

## 2019-06-15 MED ORDER — LIDOCAINE HCL (PF) 1 % IJ SOLN
INTRAMUSCULAR | Status: AC
  Administered 2019-06-15: 0.9 mL
  Filled 2019-06-15: qty 5

## 2019-06-15 MED ORDER — METRONIDAZOLE 500 MG PO TABS
2000.0000 mg | ORAL_TABLET | Freq: Once | ORAL | Status: AC
  Administered 2019-06-15: 2000 mg via ORAL
  Filled 2019-06-15: qty 4

## 2019-06-15 NOTE — Discharge Instructions (Signed)
You have been diagnosed with inflammatory pelvic disease, trichomonas, and herpes simplex.  Takes medications prescribed.  Avoid sexual activity until your symptoms resolved.  Notify your partner to get tested.

## 2019-06-15 NOTE — ED Triage Notes (Signed)
Pt reports exposure to STDs, would like to be checked.

## 2019-06-15 NOTE — ED Provider Notes (Signed)
[facility] EMERGENCY DEPARTMENT Provider Note   CSN: [id number] Arrival date & time: 06/15/19  1721     History   Chief Complaint Chief Complaint  Patient presents with   Exposure to STD    HPI Carol Potter is a 33 y.o. female.     The history is provided by the patient. No language interpreter was used.  Exposure to STD     32 year old female presenting with request to be tested for potential STD.  Patient states she had sexual intercourse with a new partner 4 days ago and states in the process the condom broke.  For the past 2 days she noticed a blistering burning rash around her vaginal area which are concerning for herpes.  She also endorsed lower abdominal and pelvic pain that she was concerning for potential other STI.  She mentioned she has history of gonorrhea and chlamydia in the past.  She denies fever chills no chest pain shortness of breath productive cough nausea vomiting or diarrhea.  She denies any dysuria or vaginal discharge.  She describes abdominal pain as mild achy sensation persistent nonradiating.  Past Medical History:  Diagnosis Date   Brachydactyly    type C   Bronchitis     There are no active problems to display for this patient.   Past Surgical History:  Procedure Laterality Date   arm surgery     foot surgery     HIP SURGERY       OB History   No obstetric history on file.      Home Medications    Prior to Admission medications   Not on File    Family History No family history on file.  Social History Social History   Tobacco Use   Smoking status: Not on file  Substance Use Topics   Alcohol use: Not on file   Drug use: Not on file     Allergies   Penicillins   Review of Systems Review of Systems  All other systems reviewed and are negative.    Physical Exam Updated Vital Signs BP 120/67 (BP Location: Right Arm)    Pulse 85    Temp 98.9 F (37.2 C) (Oral)    Resp 16    Ht 5'  (1.524 m)    Wt 65.8 kg    LMP 11/14/2018    SpO2 100%    BMI 28.32 kg/m   Physical Exam Vitals signs and nursing note reviewed.  Constitutional:      General: She is not in acute distress.    Appearance: She is well-developed.  HENT:     Head: Atraumatic.  Eyes:     Conjunctiva/sclera: Conjunctivae normal.  Neck:     Musculoskeletal: Neck supple.  Cardiovascular:     Rate and Rhythm: Normal rate and regular rhythm.     Pulses: Normal pulses.     Heart sounds: Normal heart sounds.  Pulmonary:     Effort: Pulmonary effort is normal.     Breath sounds: Normal breath sounds.  Abdominal:     Palpations: Abdomen is soft.     Tenderness: There is abdominal tenderness (Mild suprapubic tenderness no guarding or rebound tenderness).  Genitourinary:    Comments: Chaperone present during exam.  No inguinal lymphadenopathy or inguinal hernia noted.  There is 2 small vesicular lesion noted to the perianal region mildly tender to palpation.  No significant discomfort with speculum insertion.  Normal vaginal vault, cervical os is closed  and free of lesion or rash.  No significant vaginal discharge.  On bimanual examination patient does have cervical motion tenderness but no adnexal tenderness. Skin:    Findings: No rash.  Neurological:     Mental Status: She is alert.      ED Treatments / Results  Labs (all labs ordered are listed, but only abnormal results are displayed) Labs Reviewed  WET PREP, GENITAL - Abnormal; Notable for the following components:      Result Value   Trich, Wet Prep PRESENT (*)    WBC, Wet Prep HPF POC FEW (*)    All other components within normal limits  URINALYSIS, ROUTINE W REFLEX MICROSCOPIC - Abnormal; Notable for the following components:   Color, Urine STRAW (*)    All other components within normal limits  RPR  HIV ANTIBODY (ROUTINE TESTING W REFLEX)  HIV4GL SAVE TUBE  POC URINE PREG, ED  GC/CHLAMYDIA PROBE AMP ([place]) NOT AT [facility]     EKG None  Radiology No results found.  Procedures Procedures (including critical care time)  Medications Ordered in ED Medications  metroNIDAZOLE (FLAGYL) tablet 2,000 mg (has no administration in time range)  cefTRIAXone (ROCEPHIN) injection 250 mg (250 mg Intramuscular Given 06/15/19 1957)  azithromycin (ZITHROMAX) tablet 1,000 mg (1,000 mg Oral Given 06/15/19 1957)  fluconazole (DIFLUCAN) tablet 150 mg (150 mg Oral Given 06/15/19 1957)  lidocaine (PF) (XYLOCAINE) 1 % injection (0.9 mLs  Given 06/15/19 2001)     Initial Impression / Assessment and Plan / ED Course  I have reviewed the triage vital signs and the nursing notes.  Pertinent labs & imaging results that were available during my care of the patient were reviewed by me and considered in my medical decision making (see chart for details).        BP 120/67 (BP Location: Right Arm)    Pulse 85    Temp 98.9 F (37.2 C) (Oral)    Resp 16    Ht 5' (1.524 m)    Wt 65.8 kg    LMP 11/14/2018    SpO2 100%    BMI 28.32 kg/m    Final Clinical Impressions(s) / ED Diagnoses   Final diagnoses:  Herpes simplex infection of perianal skin  Trichomoniasis  PID (acute pelvic inflammatory disease)    ED Discharge Orders         Ordered    acyclovir (ZOVIRAX) 400 MG tablet  4 times daily     06/15/19 2038    doxycycline (VIBRAMYCIN) 100 MG capsule  2 times daily     06/15/19 2038         8:17 PM Patient developed a vesicular rash to the perineal region after sexual activities.  She voiced concern for herpes.  Rash does not appear to suggest herpetic lesion.  Will treat with acyclovir.  She also voiced concern for potential other STI.  Pelvic examination performed with cervical motion tenderness therefore she would benefit from treatment for PID.  Rocephin and Zithromax given.  Wet prep showing evidence of trichomonas.  Will treat with Flagyl 2 g p.o.   Fayrene Helper, PA-C 06/15/19 2040    Vanetta Mulders, MD 06/28/19  1756

## 2019-06-17 LAB — GC/CHLAMYDIA PROBE AMP (~~LOC~~) NOT AT ARMC
Chlamydia: NEGATIVE
Molecular Disclaimer: NEGATIVE
Molecular Disclaimer: NORMAL
Neisseria Gonorrhea: NEGATIVE

## 2019-06-17 LAB — RPR: RPR Ser Ql: NONREACTIVE

## 2020-09-09 ENCOUNTER — Other Ambulatory Visit: Payer: Self-pay

## 2020-09-09 ENCOUNTER — Encounter (HOSPITAL_COMMUNITY): Payer: Self-pay

## 2020-09-09 ENCOUNTER — Emergency Department (HOSPITAL_COMMUNITY): Payer: Medicare Other

## 2020-09-09 ENCOUNTER — Emergency Department (HOSPITAL_COMMUNITY)
Admission: EM | Admit: 2020-09-09 | Discharge: 2020-09-09 | Disposition: A | Discharge status: Home / Self Care | Payer: Medicare Other | Attending: Emergency Medicine | Admitting: Emergency Medicine

## 2020-09-09 DIAGNOSIS — B3731 Acute candidiasis of vulva and vagina: Secondary | ICD-10-CM

## 2020-09-09 DIAGNOSIS — R0602 Shortness of breath: Secondary | ICD-10-CM | POA: Diagnosis present

## 2020-09-09 DIAGNOSIS — U071 COVID-19: Secondary | ICD-10-CM | POA: Insufficient documentation

## 2020-09-09 DIAGNOSIS — H9203 Otalgia, bilateral: Secondary | ICD-10-CM | POA: Diagnosis not present

## 2020-09-09 DIAGNOSIS — B373 Candidiasis of vulva and vagina: Secondary | ICD-10-CM | POA: Insufficient documentation

## 2020-09-09 LAB — URINALYSIS, ROUTINE W REFLEX MICROSCOPIC
Bilirubin Urine: NEGATIVE
Glucose, UA: NEGATIVE mg/dL
Hgb urine dipstick: NEGATIVE
Ketones, ur: NEGATIVE mg/dL
Leukocytes,Ua: NEGATIVE
Nitrite: NEGATIVE
Protein, ur: NEGATIVE mg/dL
Specific Gravity, Urine: 1.031 — ABNORMAL HIGH (ref 1.005–1.030)
pH: 5 (ref 5.0–8.0)

## 2020-09-09 LAB — WET PREP, GENITAL
Clue Cells Wet Prep HPF POC: NONE SEEN
Sperm: NONE SEEN
Trich, Wet Prep: NONE SEEN
Yeast Wet Prep HPF POC: NONE SEEN

## 2020-09-09 LAB — RESP PANEL BY RT-PCR (FLU A&B, COVID) ARPGX2
Influenza A by PCR: NEGATIVE
Influenza B by PCR: NEGATIVE
SARS Coronavirus 2 by RT PCR: POSITIVE — AB

## 2020-09-09 LAB — I-STAT BETA HCG BLOOD, ED (MC, WL, AP ONLY): I-stat hCG, quantitative: <5 m[IU]/mL (ref <5)

## 2020-09-09 LAB — HIV ANTIBODY (ROUTINE TESTING W REFLEX): HIV Screen 4th Generation wRfx: NONREACTIVE

## 2020-09-09 MED ORDER — FLUCONAZOLE 200 MG PO TABS
200.0000 mg | ORAL_TABLET | Freq: Once | ORAL | Status: AC
  Administered 2020-09-09: 200 mg via ORAL
  Filled 2020-09-09: qty 1

## 2020-09-09 MED ORDER — ALBUTEROL SULFATE HFA 108 (90 BASE) MCG/ACT IN AERS
2.0000 | INHALATION_SPRAY | Freq: Once | RESPIRATORY_TRACT | Status: AC
  Administered 2020-09-09: 2 via RESPIRATORY_TRACT
  Filled 2020-09-09: qty 6.7

## 2020-09-09 MED ORDER — VALACYCLOVIR HCL 1 G PO TABS: 1000.0000 mg | ORAL_TABLET | Freq: Every day | ORAL | 0 refills | Status: DC

## 2020-09-09 MED ORDER — FLUCONAZOLE 200 MG PO TABS: 200.0000 mg | ORAL_TABLET | Freq: Every day | ORAL | 0 refills | Status: AC

## 2020-09-09 NOTE — ED Triage Notes (Signed)
Pt reports sore throat, body aches and SOB since coming back from [place] on 12/22. Pt is not vaccinated for COVID. Afebrile in triage, resp e.u

## 2020-09-09 NOTE — ED Notes (Signed)
Pt requesting a urinalysis

## 2020-09-09 NOTE — ED Provider Notes (Signed)
[facility] EMERGENCY DEPARTMENT Provider Note   CSN: [id number] Arrival date & time: 09/09/20  1435     History Chief Complaint  Patient presents with  . Shortness of Breath  . Generalized Body Aches  . Sore Throat    Carol Potter is a 34 y.o. female.  The history is provided by the patient.  Shortness of Breath Severity:  Moderate Onset quality:  Gradual Duration:  5 days Timing:  Constant Progression:  Unchanged Chronicity:  New Context: URI   Relieved by:  None tried Worsened by:  Coughing Ineffective treatments:  None tried Associated symptoms: cough, ear pain and sore throat   Associated symptoms: no fever   Associated symptoms comment:  Chest pain with breathing Risk factors comment:  Recent travel to [place], also family member at home in [place] for possible covid Sore Throat Associated symptoms include shortness of breath.       Past Medical History:  Diagnosis Date  . Brachydactyly    type C  . Bronchitis     There are no problems to display for this patient.   Past Surgical History:  Procedure Laterality Date  . arm surgery    . foot surgery    . HIP SURGERY       OB History   No obstetric history on file.     No family history on file.     Home Medications Prior to Admission medications   Medication Sig Start Date End Date Taking? Authorizing Provider  acyclovir (ZOVIRAX) 400 MG tablet Take 1 tablet (400 mg total) by mouth 4 (four) times daily. 06/15/19   Fayrene Helper, PA-C  doxycycline (VIBRAMYCIN) 100 MG capsule Take 1 capsule (100 mg total) by mouth 2 (two) times daily. One po bid x 7 days 06/15/19   Fayrene Helper, PA-C    Allergies    Penicillins  Review of Systems   Review of Systems  Constitutional: Negative for fever.  HENT: Positive for ear pain and sore throat.   Respiratory: Positive for cough and shortness of breath.   Genitourinary: Positive for vaginal discharge. Negative for genital  sores.       Vaginal itching starting yesterdday.  No vaginal bleeding or dysuria.  2 sexual partners this month and no protection.  All other systems reviewed and are negative.   Physical Exam Updated Vital Signs BP 107/72   Pulse 92   Temp 99.2 F (37.3 C) (Oral)   Resp (!) 21   Ht 5\' 8"  (1.727 m)   Wt 59 kg   SpO2 99%   BMI 19.77 kg/m   Physical Exam Vitals and nursing note reviewed. Exam conducted with a chaperone present.  Constitutional:      General: She is not in acute distress.    Appearance: She is well-developed, normal weight and well-nourished.  HENT:     Head: Normocephalic and atraumatic.     Right Ear: A middle ear effusion is present.     Left Ear: A middle ear effusion is present.     Nose: Congestion present.     Mouth/Throat:     Tongue: No lesions.     Pharynx: Oropharynx is clear.  Eyes:     Extraocular Movements: EOM normal.     Pupils: Pupils are equal, round, and reactive to light.  Cardiovascular:     Rate and Rhythm: Normal rate and regular rhythm.     Pulses: Intact distal pulses.     Heart  sounds: Normal heart sounds. No murmur heard. No friction rub.  Pulmonary:     Effort: Pulmonary effort is normal.     Breath sounds: Normal breath sounds. No wheezing or rales.  Abdominal:     General: Bowel sounds are normal. There is no distension.     Palpations: Abdomen is soft.     Tenderness: There is no abdominal tenderness. There is no guarding or rebound.  Genitourinary:    Vagina: Normal. No vaginal discharge.     Cervix: Normal.     Uterus: Normal.      Adnexa: Right adnexa normal and left adnexa normal.  Musculoskeletal:        General: No tenderness. Normal range of motion.     Comments: No edema  Skin:    General: Skin is warm and dry.     Findings: No rash.  Neurological:     Mental Status: She is alert and oriented to person, place, and time. Mental status is at baseline.     Cranial Nerves: No cranial nerve deficit.   Psychiatric:        Mood and Affect: Mood and affect and mood normal.        Behavior: Behavior normal.        Thought Content: Thought content normal.      ED Results / Procedures / Treatments   Labs (all labs ordered are listed, but only abnormal results are displayed) Labs Reviewed  RESP PANEL BY RT-PCR (FLU A&B, COVID) ARPGX2 - Abnormal; Notable for the following components:      Result Value   SARS Coronavirus 2 by RT PCR POSITIVE (*)    All other components within normal limits  WET PREP, GENITAL - Abnormal; Notable for the following components:   WBC, Wet Prep HPF POC FEW (*)    All other components within normal limits  URINALYSIS, ROUTINE W REFLEX MICROSCOPIC - Abnormal; Notable for the following components:   APPearance HAZY (*)    Specific Gravity, Urine 1.031 (*)    All other components within normal limits  GC/CHLAMYDIA PROBE AMP (Altha) NOT AT [facility]    EKG EKG Interpretation  Date/Time:  Saturday September 09 2020 14:38:15 EST Ventricular Rate:  103 PR Interval:  152 QRS Duration: 70 QT Interval:  348 QTC Calculation: 455 R Axis:   58 Text Interpretation: Sinus tachycardia Nonspecific T wave abnormality No previous tracing Confirmed by Gwyneth Sprout ([id number]) on 09/09/2020 3:38:23 PM   Radiology DG Chest Portable 1 View  Result Date: 09/09/2020 CLINICAL DATA:  Shortness of breath and chest pain EXAM: PORTABLE CHEST 1 VIEW COMPARISON:  08/16/2008 FINDINGS: Cardiac shadow is within normal limits. The lungs are well aerated bilaterally. Postsurgical changes in the right shoulder are seen. No acute bony abnormality is noted. IMPRESSION: No active disease. Electronically Signed   By: Alcide Clever M.D.   On: 09/09/2020 15:07    Procedures Procedures (including critical care time)  Medications Ordered in ED Medications  albuterol (VENTOLIN HFA) 108 (90 Base) MCG/ACT inhaler 2 puff (has no administration in time range)    ED Course  I have reviewed  the triage vital signs and the nursing notes.  Pertinent labs & imaging results that were available during my care of the patient were reviewed by me and considered in my medical decision making (see chart for details).    MDM Rules/Calculators/A&P  Patient is a 34 year old female presenting today with URI symptoms that started 5 days prior to arrival.  She is complaining of some shortness of breath cough, generalized malaise but no fevers.  Patient has not been vaccinated against Covid and reports that symptoms started after she traveled to [facility].  She has history of bronchitis but no history of asthma and does not use an inhaler regularly.  Patient has a temperature of 99.2 here but is satting 99% on room air and is having no SOB.  She is able to speak in complete sentences.  CXR wnl.  COVID is positive.  Pt not a candidate for monoclonal antibody.  Also c/o of vaginal burning.  Higher risk sexual practices.  No significant findings for TOA or PID.  No lesions or d/c.  Wet prep neg.  Given pt's risk will offer treatment for STI but o/w feel pt is stable for d/c.  preg negl  6:42 PM At this point patient does not desire STD treatment and would like to wait for the cultures.  We will treat for yeast.  Patient was also told that she has Covid and she will continue to quarantine.  Otherwise she is stable for discharge.  MDM Number of Diagnoses or Management Options   Amount and/or Complexity of Data Reviewed Clinical lab tests: ordered and reviewed Tests in the radiology section of CPT: ordered and reviewed Independent visualization of images, tracings, or specimens: yes  Carol T Pais was evaluated in Emergency Department on 09/09/2020 for the symptoms described in the history of present illness. She was evaluated in the context of the global COVID-19 pandemic, which necessitated consideration that the patient might be at risk for infection with the SARS-CoV-2  virus that causes COVID-19. Institutional protocols and algorithms that pertain to the evaluation of patients at risk for COVID-19 are in a state of rapid change based on information released by regulatory bodies including the CDC and federal and state organizations. These policies and algorithms were followed during the patient's care in the ED.  Final Clinical Impression(s) / ED Diagnoses Final diagnoses:  COVID  Yeast vaginitis    Rx / DC Orders ED Discharge Orders         Ordered    fluconazole (DIFLUCAN) 200 MG tablet  Daily        09/09/20 1844    valACYclovir (VALTREX) 1000 MG tablet  Daily        09/09/20 1844           Gwyneth Sprout, MD 09/09/20 1845

## 2020-09-10 ENCOUNTER — Telehealth (HOSPITAL_COMMUNITY): Payer: Self-pay | Admitting: Nurse Practitioner

## 2020-09-10 DIAGNOSIS — U071 COVID-19: Secondary | ICD-10-CM

## 2020-09-10 LAB — RPR: RPR Ser Ql: NONREACTIVE

## 2020-09-10 NOTE — Telephone Encounter (Signed)
Called to discuss with [place] T Blanck about Covid symptoms and the use of a monoclonal antibody infusion for those with mild to moderate Covid symptoms and at a high risk of hospitalization.     Pt is qualified for this infusion at the [facility] infusion center due to co-morbid conditions and/or a member of an at-risk group, however declines infusion at this time. Symptoms tier reviewed as well as criteria for ending isolation.  Symptoms reviewed that would warrant ED/Hospital evaluation. Preventative practices reviewed. Patient verbalized understanding. Patient advised to call back if he/she opts to proceed with infusion. Callback number provided. Urgent care and/or ER precautions given for severe symptoms. Last date eligible for infusion: 09/11/20. Patient is unvaccinated and says she is not interested in any treatment.   Patient assisted with goodrx pricing for her ER prescriptions. She says prescriptions were not covered by Medicaid.    There are no problems to display for this patient.  Consuello Masse, NP [phone] [email or web address]

## 2020-09-11 LAB — GC/CHLAMYDIA PROBE AMP (~~LOC~~) NOT AT ARMC
Chlamydia: NEGATIVE
Comment: NEGATIVE
Comment: NORMAL
Neisseria Gonorrhea: NEGATIVE

## 2020-11-17 ENCOUNTER — Other Ambulatory Visit: Payer: Self-pay

## 2020-11-17 ENCOUNTER — Ambulatory Visit (HOSPITAL_COMMUNITY)
Admission: EM | Admit: 2020-11-17 | Discharge: 2020-11-17 | Disposition: A | Discharge status: Home / Self Care | Payer: Medicare Other | Attending: Emergency Medicine | Admitting: Emergency Medicine

## 2020-11-17 ENCOUNTER — Encounter (HOSPITAL_COMMUNITY): Payer: Self-pay

## 2020-11-17 DIAGNOSIS — R350 Frequency of micturition: Secondary | ICD-10-CM | POA: Diagnosis not present

## 2020-11-17 DIAGNOSIS — N390 Urinary tract infection, site not specified: Secondary | ICD-10-CM | POA: Diagnosis not present

## 2020-11-17 DIAGNOSIS — R300 Dysuria: Secondary | ICD-10-CM | POA: Insufficient documentation

## 2020-11-17 DIAGNOSIS — N898 Other specified noninflammatory disorders of vagina: Secondary | ICD-10-CM | POA: Diagnosis not present

## 2020-11-17 LAB — POCT URINALYSIS DIPSTICK, ED / UC
Bilirubin Urine: NEGATIVE
Glucose, UA: NEGATIVE mg/dL
Ketones, ur: NEGATIVE mg/dL
Leukocytes,Ua: NEGATIVE
Nitrite: NEGATIVE
Protein, ur: NEGATIVE mg/dL
Specific Gravity, Urine: 1.025 (ref 1.005–1.030)
Urobilinogen, UA: 0.2 mg/dL (ref 0.0–1.0)
pH: 6.5 (ref 5.0–8.0)

## 2020-11-17 MED ORDER — FLUCONAZOLE 150 MG PO TABS: 150.0000 mg | ORAL_TABLET | Freq: Every day | ORAL | 0 refills | Status: DC

## 2020-11-17 NOTE — ED Provider Notes (Signed)
[facility]    CSN: [id number] Arrival date & time: 11/17/20  1413      History   Chief Complaint Chief Complaint  Patient presents with  . Urinary Tract Infection  . Vaginal Itching  . Vaginal Discharge  . Urinary Frequency    HPI [place] T Herrod is a 35 y.o. female.   HPI  Dysuria and vaginal discharge: Patient states that for the past few days she has had urinary tract infection symptoms such as dysuria, suprapubic discomfort and low back pain.  She states that her vaginal discharge is white and is exactly like when she normally has a yeast infection.  She denies any fevers, vomiting, diarrhea but has had 2 days of constipation.  She has tried some laxative which caused some bloating sensation.  She would like vaginal STI testing.   Past Medical History:  Diagnosis Date  . Brachydactyly    type C  . Bronchitis     There are no problems to display for this patient.   Past Surgical History:  Procedure Laterality Date  . arm surgery    . foot surgery    . HIP SURGERY      OB History   No obstetric history on file.      Home Medications    Prior to Admission medications   Medication Sig Start Date End Date Taking? Authorizing Provider  simethicone (MYLICON) 125 MG chewable tablet Chew 125 mg by mouth every 6 (six) hours as needed for flatulence.    [provider]  valACYclovir (VALTREX) 1000 MG tablet Take 1 tablet (1,000 mg total) by mouth daily. Take 1 tab daily for 5 days at the first signs of herpes outbreak 09/09/20   Gwyneth Sprout, MD    Family History History reviewed. No pertinent family history.  Social History Social History   Tobacco Use  . Smoking status: Never Smoker  . Smokeless tobacco: Never Used  Substance Use Topics  . Drug use: Yes    Types: Marijuana     Allergies   Penicillins and Other   Review of Systems Review of Systems  As stated above in HPI Physical Exam Triage Vital Signs ED Triage  Vitals  Enc Vitals Group     BP 11/17/20 1421 103/63     Pulse Rate 11/17/20 1421 61     Resp 11/17/20 1421 17     Temp 11/17/20 1421 97.6 F (36.4 C)     Temp Source 11/17/20 1421 Temporal     SpO2 11/17/20 1421 99 %     Weight --      Height --      Head Circumference --      Peak Flow --      Pain Score 11/17/20 1420 7     Pain Loc --      Pain Edu? --      Excl. in GC? --    No data found.  Updated Vital Signs BP 103/63 (BP Location: Right Arm)   Pulse 61   Temp 97.6 F (36.4 C) (Temporal)   Resp 17   LMP 11/05/2020 (Approximate)   SpO2 99%   Visual Acuity Right Eye Distance:   Left Eye Distance:   Bilateral Distance:    Right Eye Near:   Left Eye Near:    Bilateral Near:     Physical Exam Vitals and nursing note reviewed.  HENT:     Head: Normocephalic and atraumatic.  Cardiovascular:  Rate and Rhythm: Normal rate and regular rhythm.     Heart sounds: Normal heart sounds.  Pulmonary:     Breath sounds: Normal breath sounds.  Abdominal:     General: Abdomen is flat. Bowel sounds are normal. There is no distension.     Palpations: Abdomen is soft. There is no mass.     Tenderness: There is no abdominal tenderness. There is no right CVA tenderness, left CVA tenderness, guarding or rebound.     Hernia: No hernia is present.  Musculoskeletal:     Cervical back: Normal range of motion and neck supple.  Lymphadenopathy:     Cervical: No cervical adenopathy.  Skin:    General: Skin is warm and dry.  Neurological:     Mental Status: She is alert.  Psychiatric:     Comments: Does not look at me during encounter       UC Treatments / Results  Labs (all labs ordered are listed, but only abnormal results are displayed) Labs Reviewed  POCT URINALYSIS DIPSTICK, ED / UC - Abnormal; Notable for the following components:      Result Value   Hgb urine dipstick TRACE (*)    All other components within normal limits    EKG   Radiology No results  found.  Procedures Procedures (including critical care time)  Medications Ordered in UC Medications - No data to display  Initial Impression / Assessment and Plan / UC Course  I have reviewed the triage vital signs and the nursing notes.  Pertinent labs & imaging results that were available during my care of the patient were reviewed by me and considered in my medical decision making (see chart for details).     New.  UTI less likely given dipstick findings however we will culture and treat as appropriate.  As she states that symptoms are identical to past vaginal candidiasis outbreaks I am going to treat her for this will await her culture results.  We discussed red flag signs and symptoms.  She will continue to stay hydrated with water to try to produce a stool sample- if no sample produce in the next 24 hours or should pain worsen she will go to the ER for further evaluation and imaging.   Final Clinical Impressions(s) / UC Diagnoses   Final diagnoses:  None   Discharge Instructions   None    ED Prescriptions    None     PDMP not reviewed this encounter.   Rushie Chestnut, [place] 11/17/20 1533

## 2020-11-17 NOTE — ED Triage Notes (Signed)
Pt presents with vaginal itching, frequency and discomfort when urinating. Pt states she has noticed vaginal discharge x 2 days. Pt states her back and her stomach hurts.

## 2020-11-18 LAB — URINE CULTURE: Culture: 1000 — AB

## 2020-11-20 ENCOUNTER — Telehealth (HOSPITAL_COMMUNITY): Payer: Self-pay | Admitting: Emergency Medicine

## 2020-11-20 LAB — CERVICOVAGINAL ANCILLARY ONLY
Bacterial Vaginitis (gardnerella): POSITIVE — AB
Candida Glabrata: NEGATIVE
Candida Vaginitis: NEGATIVE
Chlamydia: NEGATIVE
Comment: NEGATIVE
Comment: NEGATIVE
Comment: NEGATIVE
Comment: NEGATIVE
Comment: NEGATIVE
Comment: NORMAL
Neisseria Gonorrhea: NEGATIVE
Trichomonas: NEGATIVE

## 2020-11-20 MED ORDER — METRONIDAZOLE 500 MG PO TABS: 500.0000 mg | ORAL_TABLET | Freq: Two times a day (BID) | ORAL | 0 refills | Status: DC

## 2020-11-27 ENCOUNTER — Emergency Department (HOSPITAL_COMMUNITY): Payer: Medicare Other

## 2020-11-27 ENCOUNTER — Encounter (HOSPITAL_COMMUNITY): Payer: Self-pay | Admitting: Emergency Medicine

## 2020-11-27 ENCOUNTER — Other Ambulatory Visit: Payer: Self-pay

## 2020-11-27 ENCOUNTER — Emergency Department (HOSPITAL_COMMUNITY)
Admission: EM | Admit: 2020-11-27 | Discharge: 2020-11-27 | Disposition: A | Discharge status: Home / Self Care | Payer: Medicare Other | Attending: Emergency Medicine | Admitting: Emergency Medicine

## 2020-11-27 DIAGNOSIS — S63095A Other dislocation of left wrist and hand, initial encounter: Secondary | ICD-10-CM

## 2020-11-27 DIAGNOSIS — Q681 Congenital deformity of finger(s) and hand: Secondary | ICD-10-CM | POA: Diagnosis not present

## 2020-11-27 DIAGNOSIS — S63092A Other subluxation of left wrist and hand, initial encounter: Secondary | ICD-10-CM | POA: Insufficient documentation

## 2020-11-27 DIAGNOSIS — M79642 Pain in left hand: Secondary | ICD-10-CM

## 2020-11-27 DIAGNOSIS — Q738 Other reduction defects of unspecified limb(s): Secondary | ICD-10-CM

## 2020-11-27 DIAGNOSIS — X58XXXA Exposure to other specified factors, initial encounter: Secondary | ICD-10-CM | POA: Diagnosis not present

## 2020-11-27 DIAGNOSIS — M25532 Pain in left wrist: Secondary | ICD-10-CM

## 2020-11-27 DIAGNOSIS — T1490XA Injury, unspecified, initial encounter: Secondary | ICD-10-CM

## 2020-11-27 DIAGNOSIS — S6992XA Unspecified injury of left wrist, hand and finger(s), initial encounter: Secondary | ICD-10-CM | POA: Diagnosis present

## 2020-11-27 MED ORDER — MELOXICAM 7.5 MG PO TABS: 7.5000 mg | ORAL_TABLET | Freq: Every day | ORAL | 0 refills | Status: DC

## 2020-11-27 NOTE — ED Notes (Addendum)
Pt has 2+ swelling at the base of the left 1st digit of hand, pt has 2+ left radial pulse, cap refill less than 3 sec, pt has full ROM of finger. The posterior of the base of the digit is a little erythematous. Pt denies numbness and tingling.

## 2020-11-27 NOTE — ED Provider Notes (Signed)
[facility] EMERGENCY DEPARTMENT Provider Note   CSN: [id number] Arrival date & time: 11/27/20  6237     History Chief Complaint  Patient presents with  . Finger Injury    Carol Potter is a 35 y.o. female.  HPI    35 year old female history of brachydactyly presents today complaining of left thumb pain.  She has no known injury.  She states the pain began during the night.  She is swollen at the base of the thumb.  She denies any abrasions, wounds, redness.  Has felt somewhat warm to her.  There is no streaking up her arm.  She has not had any fever.  She is able to move her thumb without difficulty.  She states she has had some other problems with other fingers thought to be secondary to her brachydactyly in the past.  She is not currently following with anybody. Past Medical History:  Diagnosis Date  . Brachydactyly    type C  . Bronchitis     There are no problems to display for this patient.   Past Surgical History:  Procedure Laterality Date  . arm surgery    . foot surgery    . HIP SURGERY       OB History   No obstetric history on file.     History reviewed. No pertinent family history.  Social History   Tobacco Use  . Smoking status: Never Smoker  . Smokeless tobacco: Never Used  Substance Use Topics  . Drug use: Yes    Types: Marijuana    Home Medications Prior to Admission medications   Medication Sig Start Date End Date Taking? Authorizing Provider  fluconazole (DIFLUCAN) 150 MG tablet Take 1 tablet (150 mg total) by mouth daily. 11/17/20   Rushie Chestnut, PA-C  metroNIDAZOLE (FLAGYL) 500 MG tablet Take 1 tablet (500 mg total) by mouth 2 (two) times daily. 11/20/20   Lamptey, Britta Mccreedy, MD  simethicone (MYLICON) 125 MG chewable tablet Chew 125 mg by mouth every 6 (six) hours as needed for flatulence.    [provider]  valACYclovir (VALTREX) 1000 MG tablet Take 1 tablet (1,000 mg total) by mouth daily. Take 1 tab  daily for 5 days at the first signs of herpes outbreak 09/09/20   Gwyneth Sprout, MD    Allergies    Penicillins and Other  Review of Systems   Review of Systems  All other systems reviewed and are negative.   Physical Exam Updated Vital Signs Pulse 74   Temp 98.6 F (37 C) (Oral)   Resp 18   Ht 1.727 m (5\' 8" )   Wt 59 kg   LMP 11/05/2020 (Approximate)   SpO2 100%   BMI 19.78 kg/m   Physical Exam Vitals and nursing note reviewed.  Constitutional:      General: She is not in acute distress.    Appearance: She is normal weight.  HENT:     Head: Normocephalic.     Right Ear: External ear normal.     Left Ear: External ear normal.  Eyes:     Extraocular Movements: Extraocular movements intact.     Pupils: Pupils are equal, round, and reactive to light.  Cardiovascular:     Rate and Rhythm: Normal rate and regular rhythm.     Pulses: Normal pulses.  Musculoskeletal:     Cervical back: Normal range of motion.     Comments: Chronic deformities of multiple fingers Left hand reveals thumb  that appears normal from the MCP joint distally but with tenderness at the thenar eminence.  There is no fluctuance it does not appear to be hot there is no redness or streaking Patient has full active range of motion of the joints of the palm and hand  Skin:    General: Skin is warm and dry.  Neurological:     General: No focal deficit present.     Mental Status: She is alert.  Psychiatric:        Mood and Affect: Mood normal.     ED Results / Procedures / Treatments   Labs (all labs ordered are listed, but only abnormal results are displayed) Labs Reviewed - No data to display  EKG None  Radiology CT WRIST LEFT WO CONTRAST  Result Date: 11/27/2020 CLINICAL DATA:  Abnormal wrist x-rays. EXAM: CT OF THE LEFT WRIST WITHOUT CONTRAST TECHNIQUE: Multidetector CT imaging was performed according to the standard protocol. Multiplanar CT image reconstructions were also generated.  COMPARISON:  Radiographs, same date. FINDINGS: There is moderate widening of the scapholunate joint space suggesting a scapholunate ligament tear or insufficiency. No evidence of associated DISI or settling of the capitate. No carpal bone dislocation. No distal radius or ulnar fractures. Short first metacarpal as noted on the radiographs. No metacarpal fractures are identified. No significant soft tissue abnormalities. IMPRESSION: 1. Moderate widening of the scapholunate joint space suggesting a scapholunate ligament tear or insufficiency. No evidence of associated DISI or settling of the capitate. 2. Short first metacarpal as noted on the radiographs. No metacarpal fractures are identified. Electronically Signed   By: Rudie Meyer M.D.   On: 11/27/2020 13:20   DG Hand Complete Right  Result Date: 11/27/2020 CLINICAL DATA:  Left wrist pain. EXAM: RIGHT HAND - COMPLETE 3+ VIEW COMPARISON:  Thumb radiographs from 11/27/2020 FINDINGS: The scapholunate angle is 73 degrees, in the questionably abnormal range. No obvious scapholunate interval widening. Substantially shortened middle phalanges of the index and middle fingers as well as the small finger. Substantially shortened thumb metacarpal. Potentially mildly hypoplastic ulnar styloid. Potential mild capsular calcification the proximal interphalangeal joint of the ring finger. Questionable ulnar-sided capsular calcification of the ring finger MCP joint. IMPRESSION: 1. Possible abnormal scapholunate angle, suggesting scapholunate ligament tear. 2. Brachymesophalangy of the index, middle, and small fingers. This may represent a combination of brachydactyly types C and A3. 3. First digit brachymetacarpia. 4. Faint capsular calcification in the fourth digit along the ulnar side of the MCP joint and along radial and ulnar sites of the proximal interphalangeal joint. Electronically Signed   By: Gaylyn Rong M.D.   On: 11/27/2020 10:55   DG Finger Thumb  Left  Result Date: 11/27/2020 CLINICAL DATA:  Acute left thumb pain and swelling without known injury. EXAM: LEFT THUMB 2+V COMPARISON:  None. 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 There also appears to be dislocation of the trapezium, although it is difficult to determine if it is volar or dorsal. No definite fracture is noted. IMPRESSION: Congenitally short first metacarpal. Dislocation of the trapezium is noted, although it is difficult to determine if it is volar or dorsal. No definite fracture is noted. Electronically Signed   By: Lupita Raider M.D.   On: 11/27/2020 08:30    Procedures Procedures   Medications Ordered in ED Medications - No data to display  ED Course  I have reviewed the triage vital signs and the nursing notes.  Pertinent labs & imaging results  that were available during my care of the patient were reviewed by me and considered in my medical decision making (see chart for details).  Clinical Course as of 11/27/20 0903  Mon Nov 27, 2020  7106 X-rays personally reviewed and reviewed radiologist interpretation [DR]    Clinical Course User Index [DR] Margarita Grizzle, MD   MDM Rules/Calculators/A&P                          35 year old female with history of brachydactyly presents today complaining of left arm pain.  X-Meredith Mells is concerning for trapezium dislocation.  Hand surgery has been consulted we are awaiting input. Mr. Tinnie Gens saw and evaluated.  Please reference his consult note CT pending Discussed with Earney Hamburg, plan from spica splint and follow-up with Dr. Arita Miss Final Clinical Impression(s) / ED Diagnoses Final diagnoses:  Pain of left hand  Brachydactyly  Diastasis of left scapholunate joint    Rx / DC Orders ED Discharge Orders    None       Margarita Grizzle, MD 11/27/20 1333

## 2020-11-27 NOTE — ED Notes (Signed)
Patient transported to X-ray 

## 2020-11-27 NOTE — Consult Note (Signed)
Reason for Consult:Left hand pain Referring Physician: D Ray Time called: [id number] Time at bedside: 0903   Carol Potter is an 35 y.o. female.  HPI: Carol comes in this morning with left hand pain. The only thing she can point to that may have caused it was making her bed last night. She began having pain sometime during the night and it was bad enough this morning to prompt a visit to the ED. 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 She is RHD.  Past Medical History:  Diagnosis Date  . Brachydactyly    type C  . Bronchitis     Past Surgical History:  Procedure Laterality Date  . arm surgery    . foot surgery    . HIP SURGERY      History reviewed. No pertinent family history.  Social History:  reports that she has never smoked. She has never used smokeless tobacco. She reports current drug use. Drug: Marijuana. No history on file for alcohol use.  Allergies:  Allergies  Allergen Reactions  . Penicillins Itching    Vaginal Yeast infection  . Other Other (See Comments)    "Vaginal Yeast Infection after taking any antibiotic"    Medications: I have reviewed the patient's current medications.  No results found for this or any previous visit (from the past 48 hour(s)).  DG Finger Thumb Left  Result Date: 11/27/2020 CLINICAL DATA:  Acute left thumb pain and swelling without known injury. EXAM: LEFT THUMB 2+V COMPARISON:  None. 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 There also appears to be dislocation of the trapezium, although it is difficult to determine if it is volar or dorsal. No definite fracture is noted. IMPRESSION: Congenitally short first metacarpal. Dislocation of the trapezium is noted, although it is difficult to determine if it is volar or dorsal. No definite fracture is noted. Electronically Signed   By: Lupita Raider M.D.   On: 11/27/2020 08:30    Review of Systems   HENT: Negative for ear discharge, ear pain, hearing loss and tinnitus.   Eyes: Negative for photophobia and pain.  Respiratory: Negative for cough and shortness of breath.   Cardiovascular: Negative for chest pain.  Gastrointestinal: Negative for abdominal pain, nausea and vomiting.  Genitourinary: Negative for dysuria, flank pain, frequency and urgency.  Musculoskeletal: Positive for arthralgias (Left hand). Negative for back pain, myalgias and neck pain.  Neurological: Negative for dizziness and headaches.  Hematological: Does not bruise/bleed easily.  Psychiatric/Behavioral: The patient is not nervous/anxious.    Pulse 74, temperature 98.6 F (37 C), temperature source Oral, resp. rate 18, height 5\' 8"  (1.727 m), weight 59 kg, last menstrual period 11/05/2020, SpO2 100 %. Physical Exam Constitutional:      General: She is not in acute distress.    Appearance: She is well-developed. She is not diaphoretic.  HENT:     Head: Normocephalic and atraumatic.  Eyes:     General: No scleral icterus.       Right eye: No discharge.        Left eye: No discharge.     Conjunctiva/sclera: Conjunctivae normal.  Cardiovascular:     Rate and Rhythm: Normal rate and regular rhythm.  Pulmonary:     Effort: Pulmonary effort is normal. No respiratory distress.  Musculoskeletal:     Cervical back: Normal range of motion.     Comments: Left shoulder, elbow, wrist,  digits- no skin wounds, mild TTP base of thumb, no instability, no blocks to motion, mod pain with flex/abduction thumb against resistance  Sens  Ax/R/M/U intact  Mot   Ax/ R/ PIN/ M/ AIN/ U intact  Rad 2+  Skin:    General: Skin is warm and dry.  Neurological:     Mental Status: She is alert.  Psychiatric:        Behavior: Behavior normal.     Assessment/Plan: Left thumb pain -- Unsure of etiology but some sort of strain is most likely. Will place in thumb spica removable splint and send home with scheduled NSAID's. If not  improving should see Dr. Arita Miss as outpatient.    Freeman Caldron, PA-C Orthopedic Surgery [phone] 11/27/2020, 9:12 AM

## 2020-11-27 NOTE — Discharge Instructions (Addendum)
Wear splint as instructed Follow up with Dr. Arita Miss as discussed

## 2020-11-27 NOTE — ED Triage Notes (Signed)
Patient coming from home. Complaint of right thumb pain. Patient states she is not sure what happened but woke up with finger pain, "feels like it is sprained."

## 2020-11-27 NOTE — Progress Notes (Signed)
Orthopedic Tech Progress Note Patient Details:  Carol Potter 1985-12-22 [id number]  Ortho Devices Type of Ortho Device: Thumb velcro splint Ortho Device/Splint Location: Left Upper Extremity Ortho Device/Splint Interventions: Ordered,Application,Adjustment   Post Interventions Patient Tolerated: Well Instructions Provided: Adjustment of device,Care of device,Poper ambulation with device   Carol Potter 11/27/2020, 2:06 PM

## 2020-11-27 NOTE — ED Notes (Signed)
Pt refused to be connected to monitor.

## 2020-12-09 ENCOUNTER — Ambulatory Visit (HOSPITAL_COMMUNITY)
Admission: EM | Admit: 2020-12-09 | Discharge: 2020-12-09 | Disposition: A | Discharge status: Home / Self Care | Payer: Medicare Other | Attending: Family Medicine | Admitting: Family Medicine

## 2020-12-09 ENCOUNTER — Encounter (HOSPITAL_COMMUNITY): Payer: Self-pay

## 2020-12-09 ENCOUNTER — Other Ambulatory Visit: Payer: Self-pay

## 2020-12-09 DIAGNOSIS — B009 Herpesviral infection, unspecified: Secondary | ICD-10-CM | POA: Insufficient documentation

## 2020-12-09 DIAGNOSIS — N898 Other specified noninflammatory disorders of vagina: Secondary | ICD-10-CM | POA: Diagnosis not present

## 2020-12-09 DIAGNOSIS — N760 Acute vaginitis: Secondary | ICD-10-CM | POA: Diagnosis present

## 2020-12-09 DIAGNOSIS — R140 Abdominal distension (gaseous): Secondary | ICD-10-CM | POA: Insufficient documentation

## 2020-12-09 LAB — POCT URINALYSIS DIPSTICK, ED / UC
Bilirubin Urine: NEGATIVE
Glucose, UA: NEGATIVE mg/dL
Hgb urine dipstick: NEGATIVE
Leukocytes,Ua: NEGATIVE
Nitrite: NEGATIVE
Protein, ur: NEGATIVE mg/dL
Specific Gravity, Urine: >=1.03 (ref 1.005–1.030)
Urobilinogen, UA: 0.2 mg/dL (ref 0.0–1.0)
pH: 6 (ref 5.0–8.0)

## 2020-12-09 MED ORDER — VALACYCLOVIR HCL 1 G PO TABS: 1000.0000 mg | ORAL_TABLET | Freq: Two times a day (BID) | ORAL | 1 refills | Status: DC | PRN

## 2020-12-09 MED ORDER — FLUCONAZOLE 150 MG PO TABS: 150.0000 mg | ORAL_TABLET | ORAL | 0 refills | Status: DC

## 2020-12-09 MED ORDER — METRONIDAZOLE 500 MG PO TABS: 500.0000 mg | ORAL_TABLET | Freq: Two times a day (BID) | ORAL | 0 refills | Status: DC

## 2020-12-09 NOTE — Discharge Instructions (Addendum)
May try simethicone for gas over-the-counter

## 2020-12-09 NOTE — ED Triage Notes (Addendum)
Pt in with c/o yeast infection and UTI sxs  States she ws taking antibiotic for Bv and now she has been itching and seeing white discharge without odor. Pt also c/o lower left abdominal pain

## 2020-12-09 NOTE — ED Provider Notes (Signed)
[facility]    CSN: [id number] Arrival date & time: 12/09/20  1351      History   Chief Complaint Chief Complaint  Patient presents with  . Vaginal Itching  . Vaginal Discharge    HPI Carol Potter is a 35 y.o. female.   Patient presented today with ongoing vaginal discharge, irritation, itching after completing treatment for BV and yeast earlier this month.  States the medicine may have taken the edge off but did not solve the problem.  She denies pelvic pain, dysuria, hematuria, flank pain, abdominal pain, nausea vomiting, new sexual partners.  She is not tried anything over-the-counter sensitive to the medications.  She says she is also having issues with gas pains and the over-the-counter simethicone is not helping.  She is also requesting a refill on her Valtrex for herpes outbreaks as she is out.     Past Medical History:  Diagnosis Date  . Brachydactyly    type C  . Bronchitis     There are no problems to display for this patient.   Past Surgical History:  Procedure Laterality Date  . arm surgery    . foot surgery    . HIP SURGERY      OB History   No obstetric history on file.      Home Medications    Prior to Admission medications   Medication Sig Start Date End Date Taking? Authorizing Provider  fluconazole (DIFLUCAN) 150 MG tablet Take 1 tablet (150 mg total) by mouth once a week. 12/09/20   Particia Nearing, PA-C  meloxicam (MOBIC) 7.5 MG tablet Take 1 tablet (7.5 mg total) by mouth daily. 11/27/20   Margarita Grizzle, MD  metroNIDAZOLE (FLAGYL) 500 MG tablet Take 1 tablet (500 mg total) by mouth 2 (two) times daily. 12/09/20   Particia Nearing, PA-C  simethicone (MYLICON) 125 MG chewable tablet Chew 125 mg by mouth every 6 (six) hours as needed for flatulence.    [provider]  valACYclovir (VALTREX) 1000 MG tablet Take 1 tablet (1,000 mg total) by mouth 2 (two) times daily as needed. Take 1 tab daily for 5 days at the  first signs of herpes outbreak 12/09/20   Particia Nearing, PA-C    Family History History reviewed. No pertinent family history.  Social History Social History   Tobacco Use  . Smoking status: Never Smoker  . Smokeless tobacco: Never Used  Substance Use Topics  . Drug use: Yes    Types: Marijuana     Allergies   Penicillins and Other   Review of Systems Review of Systems Per HPI  Physical Exam Triage Vital Signs ED Triage Vitals  Enc Vitals Group     BP 12/09/20 1442 120/77     Pulse Rate 12/09/20 1442 84     Resp 12/09/20 1442 17     Temp 12/09/20 1441 98.7 F (37.1 C)     Temp src --      SpO2 12/09/20 1442 95 %     Weight --      Height --      Head Circumference --      Peak Flow --      Pain Score 12/09/20 1439 6     Pain Loc --      Pain Edu? --      Excl. in GC? --    No data found.  Updated Vital Signs BP 120/77   Pulse 84   Temp  98.7 F (37.1 C)   Resp 17   LMP 12/04/2020 (Exact Date)   SpO2 95%   Visual Acuity Right Eye Distance:   Left Eye Distance:   Bilateral Distance:    Right Eye Near:   Left Eye Near:    Bilateral Near:     Physical Exam Vitals and nursing note reviewed.  Constitutional:      Appearance: Normal appearance. She is not ill-appearing.  HENT:     Head: Atraumatic.     Nose: Nose normal.     Mouth/Throat:     Mouth: Mucous membranes are moist.     Pharynx: Oropharynx is clear.  Eyes:     Extraocular Movements: Extraocular movements intact.     Conjunctiva/sclera: Conjunctivae normal.  Cardiovascular:     Rate and Rhythm: Normal rate and regular rhythm.     Heart sounds: Normal heart sounds.  Pulmonary:     Effort: Pulmonary effort is normal.     Breath sounds: Normal breath sounds. No wheezing or rales.  Abdominal:     General: Bowel sounds are normal. There is no distension.     Palpations: Abdomen is soft.     Tenderness: There is no abdominal tenderness. There is no right CVA tenderness, left  CVA tenderness or guarding.  Genitourinary:    Comments: GU exam deferred, self swab performed today Musculoskeletal:        General: Normal range of motion.     Cervical back: Normal range of motion and neck supple.  Skin:    General: Skin is warm and dry.  Neurological:     Mental Status: She is alert and oriented to person, place, and time.  Psychiatric:        Mood and Affect: Mood normal.        Thought Content: Thought content normal.        Judgment: Judgment normal.      UC Treatments / Results  Labs (all labs ordered are listed, but only abnormal results are displayed) Labs Reviewed  POCT URINALYSIS DIPSTICK, ED / UC - Abnormal; Notable for the following components:      Result Value   Ketones, ur TRACE (*)    All other components within normal limits  CERVICOVAGINAL ANCILLARY ONLY    EKG   Radiology No results found.  Procedures Procedures (including critical care time)  Medications Ordered in UC Medications - No data to display  Initial Impression / Assessment and Plan / UC Course  I have reviewed the triage vital signs and the nursing notes.  Pertinent labs & imaging results that were available during my care of the patient were reviewed by me and considered in my medical decision making (see chart for details).     Vitals and exam reassuring today, UA benign, vaginal swab pending.  Will restart Flagyl and Diflucan while awaiting results.  We will also refill her Valtrex as requested for HSV flares as needed.  Continue the simethicone, may also start of a fiber supplement and increase fluids to help with gas pains.  Follow-up with primary care for recheck.  Final Clinical Impressions(s) / UC Diagnoses   Final diagnoses:  Bloating  HSV infection  Acute vaginitis     Discharge Instructions     May try simethicone for gas over-the-counter     ED Prescriptions    Medication Sig Dispense Auth. Provider   fluconazole (DIFLUCAN) 150 MG tablet  Take 1 tablet (150 mg total) by mouth once a  week. 3 tablet Particia Nearing, PA-C   metroNIDAZOLE (FLAGYL) 500 MG tablet Take 1 tablet (500 mg total) by mouth 2 (two) times daily. 14 tablet Particia Nearing, [place]   valACYclovir (VALTREX) 1000 MG tablet Take 1 tablet (1,000 mg total) by mouth 2 (two) times daily as needed. Take 1 tab daily for 5 days at the first signs of herpes outbreak 10 tablet Particia Nearing, [place]     PDMP not reviewed this encounter.   Particia Nearing, [place] 12/09/20 [phone]

## 2020-12-11 LAB — CERVICOVAGINAL ANCILLARY ONLY
Bacterial Vaginitis (gardnerella): POSITIVE — AB
Candida Glabrata: NEGATIVE
Candida Vaginitis: NEGATIVE
Chlamydia: NEGATIVE
Comment: NEGATIVE
Comment: NEGATIVE
Comment: NEGATIVE
Comment: NEGATIVE
Comment: NEGATIVE
Comment: NORMAL
Neisseria Gonorrhea: NEGATIVE
Trichomonas: NEGATIVE

## 2021-02-02 ENCOUNTER — Other Ambulatory Visit: Payer: Self-pay

## 2021-02-02 ENCOUNTER — Encounter (HOSPITAL_COMMUNITY): Payer: Self-pay | Admitting: Emergency Medicine

## 2021-02-02 ENCOUNTER — Ambulatory Visit (HOSPITAL_COMMUNITY)
Admission: EM | Admit: 2021-02-02 | Discharge: 2021-02-02 | Disposition: A | Discharge status: Home / Self Care | Payer: Medicare Other | Attending: Physician Assistant | Admitting: Physician Assistant

## 2021-02-02 DIAGNOSIS — J069 Acute upper respiratory infection, unspecified: Secondary | ICD-10-CM

## 2021-02-02 DIAGNOSIS — R079 Chest pain, unspecified: Secondary | ICD-10-CM | POA: Diagnosis not present

## 2021-02-02 DIAGNOSIS — Z20822 Contact with and (suspected) exposure to covid-19: Secondary | ICD-10-CM | POA: Insufficient documentation

## 2021-02-02 DIAGNOSIS — R109 Unspecified abdominal pain: Secondary | ICD-10-CM | POA: Diagnosis not present

## 2021-02-02 DIAGNOSIS — J3089 Other allergic rhinitis: Secondary | ICD-10-CM | POA: Diagnosis not present

## 2021-02-02 DIAGNOSIS — J029 Acute pharyngitis, unspecified: Secondary | ICD-10-CM | POA: Insufficient documentation

## 2021-02-02 DIAGNOSIS — Z113 Encounter for screening for infections with a predominantly sexual mode of transmission: Secondary | ICD-10-CM | POA: Diagnosis not present

## 2021-02-02 DIAGNOSIS — J3489 Other specified disorders of nose and nasal sinuses: Secondary | ICD-10-CM | POA: Insufficient documentation

## 2021-02-02 DIAGNOSIS — N898 Other specified noninflammatory disorders of vagina: Secondary | ICD-10-CM | POA: Diagnosis not present

## 2021-02-02 DIAGNOSIS — Z8616 Personal history of COVID-19: Secondary | ICD-10-CM | POA: Insufficient documentation

## 2021-02-02 DIAGNOSIS — R059 Cough, unspecified: Secondary | ICD-10-CM

## 2021-02-02 DIAGNOSIS — R051 Acute cough: Secondary | ICD-10-CM | POA: Diagnosis not present

## 2021-02-02 DIAGNOSIS — Z760 Encounter for issue of repeat prescription: Secondary | ICD-10-CM | POA: Diagnosis not present

## 2021-02-02 DIAGNOSIS — R0981 Nasal congestion: Secondary | ICD-10-CM | POA: Diagnosis not present

## 2021-02-02 DIAGNOSIS — Z880 Allergy status to penicillin: Secondary | ICD-10-CM | POA: Insufficient documentation

## 2021-02-02 DIAGNOSIS — H9209 Otalgia, unspecified ear: Secondary | ICD-10-CM | POA: Insufficient documentation

## 2021-02-02 LAB — POCT URINALYSIS DIPSTICK, ED / UC
Glucose, UA: NEGATIVE mg/dL
Hgb urine dipstick: NEGATIVE
Leukocytes,Ua: NEGATIVE
Nitrite: NEGATIVE
Protein, ur: NEGATIVE mg/dL
Specific Gravity, Urine: 1.025 (ref 1.005–1.030)
Urobilinogen, UA: 0.2 mg/dL (ref 0.0–1.0)
pH: 6.5 (ref 5.0–8.0)

## 2021-02-02 LAB — POC URINE PREG, ED: Preg Test, Ur: NEGATIVE

## 2021-02-02 MED ORDER — FLUCONAZOLE 150 MG PO TABS: 150.0000 mg | ORAL_TABLET | ORAL | 0 refills | Status: DC

## 2021-02-02 MED ORDER — PREDNISONE 20 MG PO TABS: 40.0000 mg | ORAL_TABLET | Freq: Every day | ORAL | 0 refills | Status: AC

## 2021-02-02 MED ORDER — ALBUTEROL SULFATE HFA 108 (90 BASE) MCG/ACT IN AERS: 1.0000 | INHALATION_SPRAY | Freq: Four times a day (QID) | RESPIRATORY_TRACT | 1 refills | Status: DC | PRN

## 2021-02-02 NOTE — ED Triage Notes (Signed)
Pt presents with reoccurring vaginal discharge and abdominal pain. States completed treatment at visit in March but symptoms did not subside.  Pt also c/o sinus pain and pressure and scratchy throat.

## 2021-02-02 NOTE — ED Provider Notes (Signed)
[facility]    CSN: [id number] Arrival date & time: 02/02/21  1536      History   Chief Complaint Chief Complaint  Patient presents with  . Facial Pain  . Sore Throat  . Abdominal Pain  . Otalgia  . Vaginal Discharge    HPI Carol Potter is a 35 y.o. female.   Patient presents today with a several year history of sinus pressure.  Reports associated cough, shortness of breath, sore throat, postnasal drainage, otalgia, chest pain with coughing.  Denies nausea, vomiting, dizziness, syncope.  She has not tried any over-the-counter medications for symptom management.  She does have a history of allergies but states current symptoms are under more extreme than typical symptoms.  She does smoke but denies history of asthma or COPD.  She did have COVID-19 in December 2021 and had albuterol inhaler that was very helpful.  She is requesting refill of this medication if appropriate today.  She is open to COVID-19 testing.  Has not COVID-19 vaccination.  Does report she recently traveled to [facility] and could have been exposed to someone on the plane but denies any known sick contacts.  In addition, patient is interested in STI panel.  She does report some lower abdominal pain but denies any pelvic pain, nausea, vomiting.  She is previously had recurrent BV and is currently having vaginal discharge.  She reports often also having yeast infection and is requesting yeast infection medication if she is prescribed any medicine as she is very sensitive to medications.  She denies any changes to personal hygiene products including soaps or detergents.  She has completed course of metronidazole in the past but continues to have recurrent symptoms.  She has not seen OB/GYN recently.      Past Medical History:  Diagnosis Date  . Brachydactyly    type C  . Bronchitis     There are no problems to display for this patient.   Past Surgical History:  Procedure Laterality Date  . arm  surgery    . foot surgery    . HIP SURGERY      OB History   No obstetric history on file.      Home Medications    Prior to Admission medications   Medication Sig Start Date End Date Taking? Authorizing Provider  albuterol (VENTOLIN HFA) 108 (90 Base) MCG/ACT inhaler Inhale 1-2 puffs into the lungs every 6 (six) hours as needed for wheezing or shortness of breath. 02/02/21  Yes Tahji Gamewell K, PA-C  predniSONE (DELTASONE) 20 MG tablet Take 2 tablets (40 mg total) by mouth daily with breakfast for 4 days. 02/02/21 02/06/21 Yes Ivey Cina K, PA-C  fluconazole (DIFLUCAN) 150 MG tablet Take 1 tablet (150 mg total) by mouth once a week. 02/02/21   Alanee Ting, Noberto Retort, PA-C  meloxicam (MOBIC) 7.5 MG tablet Take 1 tablet (7.5 mg total) by mouth daily. 11/27/20   Margarita Grizzle, MD  metroNIDAZOLE (FLAGYL) 500 MG tablet Take 1 tablet (500 mg total) by mouth 2 (two) times daily. 12/09/20   Particia Nearing, PA-C  simethicone (MYLICON) 125 MG chewable tablet Chew 125 mg by mouth every 6 (six) hours as needed for flatulence.    [provider]  valACYclovir (VALTREX) 1000 MG tablet Take 1 tablet (1,000 mg total) by mouth 2 (two) times daily as needed. Take 1 tab daily for 5 days at the first signs of herpes outbreak 12/09/20   Particia Nearing, PA-C  Family History History reviewed. No pertinent family history.  Social History Social History   Tobacco Use  . Smoking status: Never Smoker  . Smokeless tobacco: Never Used  Substance Use Topics  . Drug use: Yes    Types: Marijuana     Allergies   Penicillins and Other   Review of Systems Review of Systems  Constitutional: Negative for activity change, appetite change, fatigue and fever.  HENT: Positive for congestion, postnasal drip, rhinorrhea, sinus pressure and sore throat. Negative for sneezing.   Respiratory: Positive for cough. Negative for shortness of breath.   Cardiovascular: Negative for chest pain.   Gastrointestinal: Negative for abdominal pain, diarrhea, nausea and vomiting.  Genitourinary: Positive for vaginal discharge. Negative for vaginal bleeding and vaginal pain.  Musculoskeletal: Negative for arthralgias and myalgias.  Neurological: Negative for dizziness, light-headedness and headaches.     Physical Exam Triage Vital Signs ED Triage Vitals  Enc Vitals Group     BP 02/02/21 1604 120/61     Pulse Rate 02/02/21 1604 74     Resp 02/02/21 1604 16     Temp 02/02/21 1604 98.8 F (37.1 C)     Temp Source 02/02/21 1604 Oral     SpO2 02/02/21 1604 100 %     Weight --      Height --      Head Circumference --      Peak Flow --      Pain Score 02/02/21 1602 0     Pain Loc --      Pain Edu? --      Excl. in GC? --    No data found.  Updated Vital Signs BP 120/61 (BP Location: Right Arm)   Pulse 74   Temp 98.8 F (37.1 C) (Oral)   Resp 16   LMP 01/19/2021   SpO2 100%   Visual Acuity Right Eye Distance:   Left Eye Distance:   Bilateral Distance:    Right Eye Near:   Left Eye Near:    Bilateral Near:     Physical Exam Vitals reviewed.  Constitutional:      General: She is awake. She is not in acute distress.    Appearance: Normal appearance. She is not ill-appearing.     Comments: Very pleasant female appears stated age in no acute distress  HENT:     Head: Normocephalic and atraumatic.     Right Ear: Tympanic membrane, ear canal and external ear normal. Tympanic membrane is not erythematous or bulging.     Left Ear: Tympanic membrane, ear canal and external ear normal. Tympanic membrane is not erythematous or bulging.     Nose:     Right Sinus: Maxillary sinus tenderness and frontal sinus tenderness present.     Left Sinus: Maxillary sinus tenderness and frontal sinus tenderness present.     Mouth/Throat:     Pharynx: Uvula midline. No oropharyngeal exudate or posterior oropharyngeal erythema.     Comments: Drainage present posterior  oropharynx Cardiovascular:     Rate and Rhythm: Normal rate and regular rhythm.     Heart sounds: No murmur heard.   Pulmonary:     Effort: Pulmonary effort is normal.     Breath sounds: Normal breath sounds. No wheezing, rhonchi or rales.     Comments: Clear to auscultation bilaterally Abdominal:     General: Bowel sounds are normal.     Palpations: Abdomen is soft.     Tenderness: There is no abdominal tenderness.  Genitourinary:    Comments: Exam deferred Lymphadenopathy:     Head:     Right side of head: No submental, submandibular or tonsillar adenopathy.     Left side of head: No submental, submandibular or tonsillar adenopathy.     Cervical: No cervical adenopathy.  Psychiatric:        Behavior: Behavior is cooperative.      UC Treatments / Results  Labs (all labs ordered are listed, but only abnormal results are displayed) Labs Reviewed  POCT URINALYSIS DIPSTICK, ED / UC - Abnormal; Notable for the following components:      Result Value   Bilirubin Urine SMALL (*)    Ketones, ur TRACE (*)    All other components within normal limits  SARS CORONAVIRUS 2 (TAT 6-24 HRS)  URINE CULTURE  POC URINE PREG, ED  CERVICOVAGINAL ANCILLARY ONLY    EKG   Radiology No results found.  Procedures Procedures (including critical care time)  Medications Ordered in UC Medications - No data to display  Initial Impression / Assessment and Plan / UC Course  I have reviewed the triage vital signs and the nursing notes.  Pertinent labs & imaging results that were available during my care of the patient were reviewed by me and considered in my medical decision making (see chart for details).     Suspect your symptoms are related to beginning a viral infection or severe allergies.  No indication for antibiotic based on exam today.  COVID test was obtained and is pending.  We will start prednisone to help manage symptoms and patient was instructed not to take NSAIDs with this  medication.  She was given Diflucan in case develops yeast infection.  Was prescribed albuterol inhaler as previously provided relief of symptoms.  She was encouraged use over-the-counter medications including Mucinex, Flonase, antihistamines to manage symptoms.  Strict return precautions given to which patient expressed understanding.  STI swab collected today-results pending.  She was given treatment for yeast infection given she frequently gets this with medication changes.  We will determine if additional treatment is necessary based on lab results.  Strict return precautions given to which patient expressed understanding  Final Clinical Impressions(s) / UC Diagnoses   Final diagnoses:  Upper respiratory tract infection, unspecified type  Nasal congestion  Cough  Sinus pressure  Vaginal discharge  Routine screening for STI (sexually transmitted infection)     Discharge Instructions     We have tested you for COVID.  Please start prednisone 40 mg daily for 4 days.  Do not take NSAIDs (aspirin, ibuprofen/Advil, naproxen/Aleve) with this medication.  I have called in Diflucan in case you get a yeast infection.  Use albuterol as needed for shortness of breath.  We will be in touch with your swab results if we need to arrange any additional treatment.  Please use over-the-counter medications such as Mucinex, Flonase, antihistamines to manage symptoms.  If anything worsens please return for reevaluation.    ED Prescriptions    Medication Sig Dispense Auth. Provider   fluconazole (DIFLUCAN) 150 MG tablet Take 1 tablet (150 mg total) by mouth once a week. 2 tablet Saber Dickerman K, PA-C   predniSONE (DELTASONE) 20 MG tablet Take 2 tablets (40 mg total) by mouth daily with breakfast for 4 days. 8 tablet Samariah Hokenson K, PA-C   albuterol (VENTOLIN HFA) 108 (90 Base) MCG/ACT inhaler Inhale 1-2 puffs into the lungs every 6 (six) hours as needed for wheezing or shortness of breath.  8 g Drucella Karbowski, Noberto Retort,  PA-C     PDMP not reviewed this encounter.   Jeani Hawking, PA-C 02/02/21 1643

## 2021-02-02 NOTE — Discharge Instructions (Addendum)
We have tested you for COVID.  Please start prednisone 40 mg daily for 4 days.  Do not take NSAIDs (aspirin, ibuprofen/Advil, naproxen/Aleve) with this medication.  I have called in Diflucan in case you get a yeast infection.  Use albuterol as needed for shortness of breath.  We will be in touch with your swab results if we need to arrange any additional treatment.  Please use over-the-counter medications such as Mucinex, Flonase, antihistamines to manage symptoms.  If anything worsens please return for reevaluation.

## 2021-02-03 LAB — SARS CORONAVIRUS 2 (TAT 6-24 HRS): SARS Coronavirus 2: NEGATIVE

## 2021-02-04 LAB — URINE CULTURE

## 2021-02-05 ENCOUNTER — Other Ambulatory Visit (INDEPENDENT_AMBULATORY_CARE_PROVIDER_SITE_OTHER): Payer: Self-pay

## 2021-02-05 LAB — CERVICOVAGINAL ANCILLARY ONLY
Bacterial Vaginitis (gardnerella): POSITIVE — AB
Candida Glabrata: NEGATIVE
Candida Vaginitis: NEGATIVE
Chlamydia: NEGATIVE
Comment: NEGATIVE
Comment: NEGATIVE
Comment: NEGATIVE
Comment: NEGATIVE
Comment: NEGATIVE
Comment: NORMAL
Neisseria Gonorrhea: NEGATIVE
Trichomonas: NEGATIVE

## 2021-02-06 ENCOUNTER — Telehealth (HOSPITAL_COMMUNITY): Payer: Self-pay | Admitting: Emergency Medicine

## 2021-02-06 MED ORDER — METRONIDAZOLE 500 MG PO TABS: 500.0000 mg | ORAL_TABLET | Freq: Two times a day (BID) | ORAL | 0 refills | Status: DC

## 2021-03-24 ENCOUNTER — Encounter (HOSPITAL_COMMUNITY): Payer: Self-pay | Admitting: Emergency Medicine

## 2021-03-24 ENCOUNTER — Ambulatory Visit (HOSPITAL_COMMUNITY)
Admission: EM | Admit: 2021-03-24 | Discharge: 2021-03-24 | Disposition: A | Discharge status: Home / Self Care | Payer: Medicare Other | Attending: Urgent Care | Admitting: Urgent Care

## 2021-03-24 ENCOUNTER — Encounter (HOSPITAL_COMMUNITY): Payer: Self-pay

## 2021-03-24 ENCOUNTER — Other Ambulatory Visit: Payer: Self-pay

## 2021-03-24 ENCOUNTER — Observation Stay (HOSPITAL_COMMUNITY)
Admission: EM | Admit: 2021-03-24 | Discharge: 2021-03-25 | Disposition: A | Discharge status: Home / Self Care | Payer: Medicare Other | Attending: Internal Medicine | Admitting: Internal Medicine

## 2021-03-24 DIAGNOSIS — L509 Urticaria, unspecified: Secondary | ICD-10-CM

## 2021-03-24 DIAGNOSIS — T7840XA Allergy, unspecified, initial encounter: Secondary | ICD-10-CM | POA: Diagnosis not present

## 2021-03-24 DIAGNOSIS — T63441A Toxic effect of venom of bees, accidental (unintentional), initial encounter: Secondary | ICD-10-CM | POA: Diagnosis present

## 2021-03-24 DIAGNOSIS — T782XXA Anaphylactic shock, unspecified, initial encounter: Secondary | ICD-10-CM | POA: Diagnosis not present

## 2021-03-24 DIAGNOSIS — Z20822 Contact with and (suspected) exposure to covid-19: Secondary | ICD-10-CM | POA: Insufficient documentation

## 2021-03-24 DIAGNOSIS — Z91030 Bee allergy status: Secondary | ICD-10-CM | POA: Diagnosis not present

## 2021-03-24 DIAGNOSIS — R110 Nausea: Secondary | ICD-10-CM | POA: Diagnosis not present

## 2021-03-24 DIAGNOSIS — T783XXA Angioneurotic edema, initial encounter: Secondary | ICD-10-CM | POA: Diagnosis not present

## 2021-03-24 LAB — URINALYSIS, ROUTINE W REFLEX MICROSCOPIC
Bilirubin Urine: NEGATIVE
Glucose, UA: NEGATIVE mg/dL
Hgb urine dipstick: NEGATIVE
Ketones, ur: 5 mg/dL — AB
Leukocytes,Ua: NEGATIVE
Nitrite: NEGATIVE
Protein, ur: NEGATIVE mg/dL
Specific Gravity, Urine: 1.018 (ref 1.005–1.030)
pH: 5 (ref 5.0–8.0)

## 2021-03-24 LAB — CBC WITH DIFFERENTIAL/PLATELET
Abs Immature Granulocytes: 0.03 10*3/uL (ref 0.00–0.07)
Basophils Absolute: 0 10*3/uL (ref 0.0–0.1)
Basophils Relative: 0 %
Eosinophils Absolute: 0 10*3/uL (ref 0.0–0.5)
Eosinophils Relative: 0 %
HCT: 43.2 % (ref 36.0–46.0)
Hemoglobin: 13.9 g/dL (ref 12.0–15.0)
Immature Granulocytes: 1 %
Lymphocytes Relative: 5 %
Lymphs Abs: 0.3 10*3/uL — ABNORMAL LOW (ref 0.7–4.0)
MCH: 27.4 pg (ref 26.0–34.0)
MCHC: 32.2 g/dL (ref 30.0–36.0)
MCV: 85.2 fL (ref 80.0–100.0)
Monocytes Absolute: 0 10*3/uL — ABNORMAL LOW (ref 0.1–1.0)
Monocytes Relative: 0 %
Neutro Abs: 6.1 10*3/uL (ref 1.7–7.7)
Neutrophils Relative %: 94 %
Platelets: 264 10*3/uL (ref 150–400)
RBC: 5.07 MIL/uL (ref 3.87–5.11)
RDW: 13.2 % (ref 11.5–15.5)
WBC: 6.6 10*3/uL (ref 4.0–10.5)
nRBC: 0 % (ref 0.0–0.2)

## 2021-03-24 LAB — PREGNANCY, URINE: Preg Test, Ur: NEGATIVE

## 2021-03-24 LAB — WET PREP, GENITAL
Clue Cells Wet Prep HPF POC: NONE SEEN
Sperm: NONE SEEN
Trich, Wet Prep: NONE SEEN
Yeast Wet Prep HPF POC: NONE SEEN

## 2021-03-24 LAB — COMPREHENSIVE METABOLIC PANEL
ALT: 19 U/L (ref 0–44)
AST: 20 U/L (ref 15–41)
Albumin: 3.9 g/dL (ref 3.5–5.0)
Alkaline Phosphatase: 50 U/L (ref 38–126)
Anion gap: 8 (ref 5–15)
BUN: 7 mg/dL (ref 6–20)
CO2: 23 mmol/L (ref 22–32)
Calcium: 9 mg/dL (ref 8.9–10.3)
Chloride: 106 mmol/L (ref 98–111)
Creatinine, Ser: 0.89 mg/dL (ref 0.44–1.00)
GFR, Estimated: >60 mL/min (ref >60)
Glucose, Bld: 237 mg/dL — ABNORMAL HIGH (ref 70–99)
Potassium: 3.8 mmol/L (ref 3.5–5.1)
Sodium: 137 mmol/L (ref 135–145)
Total Bilirubin: 0.6 mg/dL (ref 0.3–1.2)
Total Protein: 6.5 g/dL (ref 6.5–8.1)

## 2021-03-24 MED ORDER — ONDANSETRON HCL 4 MG/2ML IJ SOLN
4.0000 mg | Freq: Once | INTRAMUSCULAR | Status: AC
Start: 2021-03-24 — End: 2021-03-24
  Administered 2021-03-24: 4 mg via INTRAVENOUS
  Filled 2021-03-24: qty 2

## 2021-03-24 MED ORDER — DIPHENHYDRAMINE HCL 50 MG/ML IJ SOLN
25.0000 mg | Freq: Once | INTRAMUSCULAR | Status: AC
  Administered 2021-03-24: 25 mg via INTRAVENOUS
  Filled 2021-03-24: qty 1

## 2021-03-24 MED ORDER — ACETAMINOPHEN 650 MG RE SUPP: 650.0000 mg | Freq: Four times a day (QID) | RECTAL | Status: DC | PRN

## 2021-03-24 MED ORDER — METHYLPREDNISOLONE SODIUM SUCC 125 MG IJ SOLR
125.0000 mg | Freq: Once | INTRAMUSCULAR | Status: AC
  Administered 2021-03-24: 125 mg via INTRAMUSCULAR

## 2021-03-24 MED ORDER — METHYLPREDNISOLONE SODIUM SUCC 125 MG IJ SOLR: 125.0000 mg | Freq: Once | INTRAMUSCULAR | Status: DC

## 2021-03-24 MED ORDER — SODIUM CHLORIDE 0.9 % IV BOLUS
1000.0000 mL | Freq: Once | INTRAVENOUS | Status: AC
  Administered 2021-03-24: 1000 mL via INTRAVENOUS

## 2021-03-24 MED ORDER — FAMOTIDINE IN NACL 20-0.9 MG/50ML-% IV SOLN
20.0000 mg | Freq: Once | INTRAVENOUS | Status: AC
  Administered 2021-03-24: 20 mg via INTRAVENOUS
  Filled 2021-03-24: qty 50

## 2021-03-24 MED ORDER — EPINEPHRINE 0.3 MG/0.3ML IJ SOAJ
0.5000 mg | Freq: Once | INTRAMUSCULAR | Status: AC
  Administered 2021-03-24: 0.5 mg via INTRAMUSCULAR

## 2021-03-24 MED ORDER — ACETAMINOPHEN 325 MG PO TABS: 650.0000 mg | ORAL_TABLET | Freq: Four times a day (QID) | ORAL | Status: DC | PRN

## 2021-03-24 MED ORDER — ALBUTEROL SULFATE HFA 108 (90 BASE) MCG/ACT IN AERS
1.0000 | INHALATION_SPRAY | Freq: Once | RESPIRATORY_TRACT | Status: AC
  Administered 2021-03-24: 2 via RESPIRATORY_TRACT
  Filled 2021-03-24: qty 6.7

## 2021-03-24 MED ORDER — METHYLPREDNISOLONE SODIUM SUCC 125 MG IJ SOLR
INTRAMUSCULAR | Status: AC
  Filled 2021-03-24: qty 2

## 2021-03-24 MED ORDER — EPINEPHRINE 0.3 MG/0.3ML IJ SOAJ
0.3000 mg | Freq: Once | INTRAMUSCULAR | Status: AC
  Administered 2021-03-24: 0.3 mg via INTRAMUSCULAR
  Filled 2021-03-24: qty 0.3

## 2021-03-24 MED ORDER — SODIUM CHLORIDE 0.9 % IV BOLUS: 250.0000 mL | Freq: Once | INTRAVENOUS | Status: DC

## 2021-03-24 MED ORDER — ONDANSETRON HCL 4 MG/2ML IJ SOLN: 4.0000 mg | Freq: Four times a day (QID) | INTRAMUSCULAR | Status: DC | PRN

## 2021-03-24 MED ORDER — FENTANYL CITRATE (PF) 100 MCG/2ML IJ SOLN
50.0000 ug | Freq: Once | INTRAMUSCULAR | Status: AC
  Administered 2021-03-24: 50 ug via INTRAVENOUS
  Filled 2021-03-24: qty 2

## 2021-03-24 NOTE — H&P (Signed)
History and Physical    PLEASE NOTE THAT DRAGON DICTATION SOFTWARE WAS USED IN THE CONSTRUCTION OF THIS NOTE.   Carol Potter YQM:[id number] DOB: [birth year] DOA: 03/24/2021  PCP: Patient, No Pcp Per (Inactive) Patient coming from: home   I have personally briefly reviewed patient's old medical records in [place]  Chief Complaint: lip swelling  HPI: Carol Potter is a 35 y.o. female with medical history significant for allergy to bee stings who is admitted to [facility] on 03/24/2021 with suspected allergic reaction to bee sting after presenting from home to [facility] ED complaining of lip swelling.   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  Did not require intubation.  She conveys that she has not been stung by any bees to her knowledge leading up to today, when, at approximately 1300, she was outside and was stung by a bee on her lip.  She reports that within a few minutes of the sting, that she developed lip swelling and tingling tingling sensation of her face as well as the sensation of throat fullness.  She denied any associated difficulty swallowing, but reported mild shortness of breath in the absence of any wheezing or stridor.  She also noted associated nausea in the absence of any vomiting, abdominal pain, diarrhea, or rash, including no associated hives.  In this capacity, she presented to local urgent care where she received a single dose of an EpiPen, following which she reported improvement in her lip swelling, resolution of the tingling sensation associated with her face, as well as resolution of the sensation of throat fullness and shortness of breath.  Per recommendation from urgent care, the patient subsequently presented to [facility] emergency department for further evaluation and management of  suspected allergic reaction in response to bee sting.  While in [facility] emergency department, she reports that she started to develop return of the previous mild tingling sensation associated with her face, but in the absence of any return of her lip swelling, throat fullness, or shortness of breath.  She also denied any associated nausea, vomiting, diarrhea, abdominal pain.  Denied any associated rash, including no urticaria.  Not associate with any wheezing or stridor.  Denies any associated chest pain palpitations, diaphoresis, dizziness, presyncope, or syncope.  In the setting of potential return of of tingling sensation associated with her face, the patient received a dose of epinephrine 0.3 mg IM at [place], representing the only dose that she has received at [facility] ED, and noted ensuing resolution of the tingling sensation associated with her face.  Subsequent to this administration of epinephrine at 1945, she reports that she has been completely asymptomatic.  However, in the context of her history of reported anaphylactic reaction to bee sting as a child requiring hospitalization and in the setting of a eating the aforementioned second dose of epinephrine, the hospitalist service was asked by the EDP to admit for overnight observation in the setting of presenting allergic reaction.   She denies any recent subjective fever, chills, rigors, or generalized myalgias. Denies any recent headache, neck stiffness, rhinitis, rhinorrhea, sore throat, cough. No recent traveling or known COVID-19 exposures.      ED Course:  Vital signs in the ED were notable for the following: Tetramex 98.7, heart rate 70-91; blood pressure 112/81 -135/90; respiratory  rate 16-20; oxygen saturation 95 to 98% on room air.  Labs were notable for the following: CMP was notable for the following: Sodium 137, potassium 3.8, bicarbonate 23, creatinine 0.89, and liver enzymes were found to be within normal limits.  Serum  magnesium level 1.9.  CBC notable for white cell count 6600, hemoglobin 13.9.  Urinalysis showed no white blood cells, leukocyte Estrace negative, and nitrate negative.  Screening nasopharyngeal COVID-19/influenza PCR were checked in the ED today and found to be negative.  While in the ED, the following were administered: In addition to the previously mentioned epinephrine 0.3 mg IM x1 at 1945, the patient also received Benadryl 25 mg IV x1, Zofran 4 mg IV x1, famotidine 20 mg IV x1, 1 L normal saline bolus and 2 puffs from an albuterol inhaler.  Subsequently, she was admitted to the med telemetry floor for overnight observation in the setting of suspected allergic reaction.     Review of Systems: As per HPI otherwise 10 point review of systems negative.   Past Medical History:  Diagnosis Date   Brachydactyly    type C   Bronchitis     Past Surgical History:  Procedure Laterality Date   arm surgery     foot surgery     HIP SURGERY      Social History:  reports that she has never smoked. She has never used smokeless tobacco. She reports current drug use. Drug: Marijuana. No history on file for alcohol use.   Allergies  Allergen Reactions   Penicillins Itching    Vaginal Yeast infection   Bee Venom    Other Other (See Comments)    "Vaginal Yeast Infection after taking any antibiotic"    Family history reviewed and not pertinent    Prior to Admission medications   Medication Sig Start Date End Date Taking? Authorizing Provider  albuterol (VENTOLIN HFA) 108 (90 Base) MCG/ACT inhaler Inhale 1-2 puffs into the lungs every 6 (six) hours as needed for wheezing or shortness of breath. Patient not taking: Reported on 03/24/2021 02/02/21   Raspet, Derry Skill, PA-C  meloxicam (MOBIC) 7.5 MG tablet Take 1 tablet (7.5 mg total) by mouth daily. 11/27/20   Pattricia Boss, MD  simethicone (MYLICON) 193 MG chewable tablet Chew 125 mg by mouth every 6 (six) hours as needed for flatulence.     [provider]  valACYclovir (VALTREX) 1000 MG tablet Take 1 tablet (1,000 mg total) by mouth 2 (two) times daily as needed. Take 1 tab daily for 5 days at the first signs of herpes outbreak Patient not taking: Reported on 03/24/2021 12/09/20   Volney American, PA-C     Objective    Physical Exam: Vitals:   03/24/21 1941 03/24/21 1945 03/24/21 2000 03/24/21 2100  BP: (!) 148/117 (!) 159/109 (!) 139/122 95/84  Pulse: 91 82 72 87  Resp: 19 20  20   Temp:      SpO2: 97% 96% 95% 97%  Weight:      Height:        General: appears to be stated age; alert, oriented Skin: warm, dry, no rash Head:  AT/[place] Mouth:  Oral mucosa membranes appear moist, normal dentition Neck: supple; trachea midline Heart:  RRR; did not appreciate any M/R/G Lungs: CTAB, did not appreciate any wheezes, rales, or rhonchi Abdomen: + BS; soft, ND, NT Vascular: 2+ pedal pulses b/l; 2+ radial pulses b/l Extremities: no peripheral edema, no muscle wasting Neuro: strength and sensation intact in upper  and lower extremities b/l     Labs on Admission: I have personally reviewed following labs and imaging studies  CBC: Recent Labs  Lab 03/24/21 2250  WBC 6.6  NEUTROABS 6.1  HGB 13.9  HCT 43.2  MCV 85.2  PLT 299   Basic Metabolic Panel: Recent Labs  Lab 03/24/21 2250  NA 137  K 3.8  CL 106  CO2 23  GLUCOSE 237*  BUN 7  CREATININE 0.89  CALCIUM 9.0   GFR: Estimated Creatinine Clearance: 86.8 mL/min (by C-G formula based on SCr of 0.89 mg/dL). Liver Function Tests: Recent Labs  Lab 03/24/21 2250  AST 20  ALT 19  ALKPHOS 50  BILITOT 0.6  PROT 6.5  ALBUMIN 3.9   No results for input(s): LIPASE, AMYLASE in the last 168 hours. No results for input(s): AMMONIA in the last 168 hours. Coagulation Profile: No results for input(s): INR, PROTIME in the last 168 hours. Cardiac Enzymes: No results for input(s): CKTOTAL, CKMB, CKMBINDEX, TROPONINI in the last 168 hours. BNP (last 3  results) No results for input(s): PROBNP in the last 8760 hours. HbA1C: No results for input(s): HGBA1C in the last 72 hours. CBG: No results for input(s): GLUCAP in the last 168 hours. Lipid Profile: No results for input(s): CHOL, HDL, LDLCALC, TRIG, CHOLHDL, LDLDIRECT in the last 72 hours. Thyroid Function Tests: No results for input(s): TSH, T4TOTAL, FREET4, T3FREE, THYROIDAB in the last 72 hours. Anemia Panel: No results for input(s): VITAMINB12, FOLATE, FERRITIN, TIBC, IRON, RETICCTPCT in the last 72 hours. Urine analysis:    Component Value Date/Time   COLORURINE YELLOW 03/24/2021 1930   APPEARANCEUR HAZY (A) 03/24/2021 1930   LABSPEC 1.018 03/24/2021 1930   PHURINE 5.0 03/24/2021 1930   GLUCOSEU NEGATIVE 03/24/2021 1930   HGBUR NEGATIVE 03/24/2021 1930   BILIRUBINUR NEGATIVE 03/24/2021 1930   KETONESUR 5 (A) 03/24/2021 1930   PROTEINUR NEGATIVE 03/24/2021 1930   UROBILINOGEN 0.2 02/02/2021 1620   NITRITE NEGATIVE 03/24/2021 1930   LEUKOCYTESUR NEGATIVE 03/24/2021 1930    Radiological Exams on Admission: No results found.    Assessment/Plan   Carol Potter is a 35 y.o. female with medical history significant for allergy to bee stings who is admitted to [facility] on 03/24/2021 with suspected allergic reaction to bee sting after presenting from home to [facility] ED complaining of lip swelling.    Principal Problem:   Allergic reaction Active Problems:   Anaphylactic reaction   Nausea      #) Allergic reaction: In the context of history of reported anaphylactic reaction to bee stings as a child, the patient's patients with allergic reaction after being stung by a bee around 1300 on 03/24/2021, with criteria for anaphylaxis appearing to have been met on the basis of ensuing lip swelling with respiratory involvement in the form of transient shortness of breath as well as gastrointestinal involvement in the form of transient nausea, with a timeframe of onset within a  few minutes of the bee sting, consistent with an IgE mediated anaphylactic process.  Not associate with any hypotension to meet criteria for anaphylactic shock.  No evidence of associated stridor or respiratory distress.  After receiving a total of 2 doses of epinephrine today, the patient has been completely asymptomatic over the last 4 hours, and remains hemodynamically stable.  However, in the setting of her reported history of requiring hospitalization for a lactic reaction as a child and in the setting of requiring 2 doses of epinephrine today, EDP  has requested that the patient be admitted for overnight observation for close ensuing monitoring in the setting of the above allergic reaction.  In addition to the 2 doses of epinephrine, the patient is also received IV Benadryl and IV famotidine.  In the absence of any absolute benefit of introduction of systemic corticosteroids, will refrain from administration of Septra for now.  Once again, the patient is completely asymptomatic at this point and has been hemodynamically stable throughout the duration.    Plan: Monitor on telemetry and continuous pulse oximetry.  Close monitoring of ensuing blood pressure via routine vital signs.  Repeat CMP and CBC in the morning.  Ensure the patient plan for EpiPen availability as an outpatient prior to discharge.  As needed albuterol ordered.      #) Nausea: Episode of nausea that appears to been associated with the above allergic reaction, which is subsequently resolved following epinephrine intervention, as above.  No residual nausea and no associated vomiting.  Negative COVID screen performed in the ED today.    Plan: Further evaluation and management presenting allergic reaction, as further detailed above.  Monitor strict I's and O's and daily weights.  As needed IV Zofran.  Repeat CMP and CBC in the morning.  Add on serum magnesium level.      DVT prophylaxis: scd's  Code Status: Full code Family  Communication: none Disposition Plan: Per Rounding Team Consults called: none  Admission status: Observation; med telemetry     Of note, this patient was added by me to the following Admit List/Treatment Team: mcadmits.      PLEASE NOTE THAT DRAGON DICTATION SOFTWARE WAS USED IN THE CONSTRUCTION OF THIS NOTE.   [facility] Pager [phone] From [place]  Otherwise, please contact night-coverage  [email or web address] Password [email or web address]   03/24/2021, 11:48 PM

## 2021-03-24 NOTE — Discharge Instructions (Addendum)
Please report to the emergency room now for monitoring and further intervention of your angioedema secondary to the bee sting.  We have given you IM epinephrine and Solu-Medrol as initial interventions.  Given that your vital signs are stable I have not have any transported to the hospital by ambulance.  Please go directly to the hospital now.

## 2021-03-24 NOTE — ED Triage Notes (Addendum)
Pt states she was stung in mouth by a bee 20 min prior to going to [facility].  States [facility] administered Epi Pen and steroid and sent her to ED.  Speaking in complete sentences.  Mild swelling noted to lip.

## 2021-03-24 NOTE — ED Provider Notes (Signed)
[facility]   MRN: [id number] DOB: 09-Aug-1986  Subjective:   Carol Potter is a 35 y.o. female presenting for suffering a bee sting to the right lower lip about 20 minutes ago.  Patient has known history of allergic reactions to bee stings, last 1 was many years ago when she was a child and she had to be hospitalized for this.  Since the bee sting 20 minutes ago developed lower lip swelling, tingling of her limbs and is itching.  She took Benadryl just prior to coming to the clinic.  Denies any active shortness of breath, wheezing, throat closing sensation, tongue swelling.   Current Facility-Administered Medications:    EPINEPHrine (EPI-PEN) injection 0.5 mg, 0.5 mg, Intramuscular, Once, Carol Bamberg, PA-C   methylPREDNISolone sodium succinate (SOLU-MEDROL) 125 mg/2 mL injection 125 mg, 125 mg, Intravenous, Once, Carol Bamberg, PA-C   sodium chloride 0.9 % bolus 250 mL, 250 mL, Intravenous, Once, Carol Bamberg, PA-C  Current Outpatient Medications:    albuterol (VENTOLIN HFA) 108 (90 Base) MCG/ACT inhaler, Inhale 1-2 puffs into the lungs every 6 (six) hours as needed for wheezing or shortness of breath., Disp: 8 g, Rfl: 1   fluconazole (DIFLUCAN) 150 MG tablet, Take 1 tablet (150 mg total) by mouth once a week., Disp: 2 tablet, Rfl: 0   meloxicam (MOBIC) 7.5 MG tablet, Take 1 tablet (7.5 mg total) by mouth daily., Disp: 10 tablet, Rfl: 0   metroNIDAZOLE (FLAGYL) 500 MG tablet, Take 1 tablet (500 mg total) by mouth 2 (two) times daily., Disp: 14 tablet, Rfl: 0   simethicone (MYLICON) 125 MG chewable tablet, Chew 125 mg by mouth every 6 (six) hours as needed for flatulence., Disp: , Rfl:    valACYclovir (VALTREX) 1000 MG tablet, Take 1 tablet (1,000 mg total) by mouth 2 (two) times daily as needed. Take 1 tab daily for 5 days at the first signs of herpes outbreak, Disp: 10 tablet, Rfl: 1   Allergies  Allergen Reactions   Penicillins Itching    Vaginal Yeast infection   Bee  Venom    Other Other (See Comments)    "Vaginal Yeast Infection after taking any antibiotic"    Past Medical History:  Diagnosis Date   Brachydactyly    type C   Bronchitis      Past Surgical History:  Procedure Laterality Date   arm surgery     foot surgery     HIP SURGERY      History reviewed. No pertinent family history.  Social History   Tobacco Use   Smoking status: Never   Smokeless tobacco: Never  Substance Use Topics   Drug use: Yes    Types: Marijuana    ROS   Objective:   Vitals: BP 116/72 (BP Location: Left Arm)   Pulse 81   Temp 98.3 F (36.8 C) (Oral)   Resp 18   SpO2 99%   Physical Exam Constitutional:      General: She is not in acute distress.    Appearance: Normal appearance. She is well-developed. She is not ill-appearing, toxic-appearing or diaphoretic.  HENT:     Head: Normocephalic and atraumatic.     Right Ear: External ear normal.     Left Ear: External ear normal.     Nose: Nose normal.     Mouth/Throat:     Mouth: Mucous membranes are moist.      Comments: 1+ swelling of the lower lip with puncture wound noted  as outlined. Eyes:     General: No scleral icterus.       Right eye: No discharge.        Left eye: No discharge.     Extraocular Movements: Extraocular movements intact.     Conjunctiva/sclera: Conjunctivae normal.     Pupils: Pupils are equal, round, and reactive to light.  Cardiovascular:     Rate and Rhythm: Normal rate and regular rhythm.     Pulses: Normal pulses.     Heart sounds: Normal heart sounds. No murmur heard.   No friction rub. No gallop.  Pulmonary:     Effort: Pulmonary effort is normal. No respiratory distress.     Breath sounds: Normal breath sounds. No stridor. No wheezing, rhonchi or rales.  Chest:     Chest wall: No tenderness.  Skin:    General: Skin is warm and dry.     Findings: Rash (Developing urticaria over the face) present.  Neurological:     Mental Status: She is alert and  oriented to person, place, and time.  Psychiatric:        Mood and Affect: Mood normal.        Behavior: Behavior normal.        Thought Content: Thought content normal.        Judgment: Judgment normal.    IM epinephrine at 0.5mg , 125mg  IM solumedrol.  Patient refused IV.  Assessment and Plan :   PDMP not reviewed this encounter.  1. Angioedema, initial encounter   2. Allergic reaction, initial encounter   3. Hives   4. Bee sting, accidental or unintentional, initial encounter     Angioedema secondary to bee sting.  Above interventions were performed emergently.  Emphasized need for further evaluation and monitoring and intervention in the emergency room as these kinds of allergic reactions are high risk for anaphylaxis.  Patient contracts for safety and will have a family member drive her to the emergency room now.   , PA-C 03/24/21 1553

## 2021-03-24 NOTE — ED Notes (Signed)
Patient is being discharged from the Urgent Care and sent to the Emergency Department via pov . Per Wallis Bamberg, PA, patient is in need of higher level of care due to allergic reaction, bee sting. Patient is aware and verbalizes understanding of plan of care.  Vitals:   03/24/21 1436  BP: 116/72  Pulse: 81  Resp: 18  Temp: 98.3 F (36.8 C)  SpO2: 99%

## 2021-03-24 NOTE — ED Notes (Signed)
Pt c/o a headache  Pain in the lower lip from the yellow jacket sting from 1400

## 2021-03-24 NOTE — ED Notes (Signed)
Pt returned from ct

## 2021-03-24 NOTE — ED Provider Notes (Signed)
[facility] Provider Note   CSN: [id number] Arrival date & time: 03/24/21  1509     History Chief Complaint  Patient presents with   Allergic Reaction    Carol Potter is a 35 y.o. female presents for evaluation of allergic reaction from a bee sting.  She reports about 115 this afternoon, she was outside and got stung by bee to her right lower lip.  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  The last time was when she was a child and states that she required admission.  She has not had any episodes since then.  She reports that initially, she felt like her lip was swollen and so she went home and took some Benadryl.  She states that she also had some tingling sensation in her arms as well as well as itching.  She waited and she stated that symptoms were not getting better so she went to urgent care.  Urgent care, she was given epinephrine pen and 125 mg of IM Solu-Medrol.  She was sent over to the ED for further evaluation.  Patient reports she feels like the swelling has gone down.  She states her throat was dry but she never had a sensation of feeling like her throat was closing, difficulty swallowing, vomiting, difficulty breathing.  Patient states she was also concerned about vaginal irritation, itching that she has been having for the last week or so.  She has noted some thick white discharge.  She has not noted any rash.  She is currently sexually active with 1 sexual partner.  She states her last menstrual cycle was the beginning of July.  She has not any vaginal bleeding since then.  Patient states she is also had some nasal congestion, rhinorrhea, postnasal drip that has been ongoing for the last several days.  No cough.  The history is provided by the patient.      Past Medical History:  Diagnosis Date   Brachydactyly    type C   Bronchitis     Patient Active Problem List   Diagnosis Date Noted   Allergic reaction  03/24/2021    Past Surgical History:  Procedure Laterality Date   arm surgery     foot surgery     HIP SURGERY       OB History   No obstetric history on file.     No family history on file.  Social History   Tobacco Use   Smoking status: Never   Smokeless tobacco: Never  Substance Use Topics   Drug use: Yes    Types: Marijuana    Home Medications Prior to Admission medications   Medication Sig Start Date End Date Taking? Authorizing Provider  albuterol (VENTOLIN HFA) 108 (90 Base) MCG/ACT inhaler Inhale 1-2 puffs into the lungs every 6 (six) hours as needed for wheezing or shortness of breath. Patient not taking: Reported on 03/24/2021 02/02/21   Raspet, Noberto RetortErin K, PA-C  meloxicam (MOBIC) 7.5 MG tablet Take 1 tablet (7.5 mg total) by mouth daily. 11/27/20   Margarita Grizzleay, Danielle, MD  simethicone (MYLICON) 125 MG chewable tablet Chew 125 mg by mouth every 6 (six) hours as needed for flatulence.    [provider]  valACYclovir (VALTREX) 1000 MG tablet Take 1 tablet (1,000 mg total) by mouth 2 (two) times daily as needed. Take 1 tab daily for 5 days at the first signs of herpes outbreak Patient not taking: Reported  on 03/24/2021 12/09/20   Particia Nearing, PA-C    Allergies    Penicillins, Bee venom, and Other  Review of Systems   Review of Systems  Constitutional:  Negative for fever.  HENT:  Positive for congestion, facial swelling and rhinorrhea. Negative for trouble swallowing.   Respiratory:  Negative for cough and shortness of breath.   Cardiovascular:  Negative for chest pain.  Gastrointestinal:  Negative for abdominal pain, nausea and vomiting.  Genitourinary:  Positive for vaginal discharge. Negative for dysuria, hematuria, vaginal bleeding and vaginal pain.  Neurological:  Negative for headaches.  All other systems reviewed and are negative.  Physical Exam Updated Vital Signs BP 95/84   Pulse 87   Temp 98.7 F (37.1 C)   Resp 20   Ht 5\' 8"  (1.727 m)    Wt 63.5 kg   SpO2 97%   BMI 21.29 kg/m   Physical Exam Vitals and nursing note reviewed.  Constitutional:      Appearance: Normal appearance. She is well-developed.  HENT:     Head: Normocephalic and atraumatic.     Nose:     Comments: Bilateral nasal turbinates are slightly edematous, erythematous.    Mouth/Throat:     Comments: Small bee sting noted just under the right lower lip.  Lips themselves with no angioedema.  Posterior oropharynx is clear without any signs of erythema, edema.  No oral swelling.  Airways patent, phonation is intact. Eyes:     General: Lids are normal.     Conjunctiva/sclera: Conjunctivae normal.     Pupils: Pupils are equal, round, and reactive to light.  Cardiovascular:     Rate and Rhythm: Normal rate and regular rhythm.     Pulses: Normal pulses.          Radial pulses are 2+ on the right side and 2+ on the left side.     Heart sounds: Normal heart sounds. No murmur heard.   No friction rub. No gallop.  Pulmonary:     Effort: Pulmonary effort is normal.     Breath sounds: Normal breath sounds.     Comments: Lungs clear to auscultation bilaterally.  Symmetric chest rise.  No wheezing, rales, rhonchi. No evidence of respiratory distress. Able to speak in full sentences without any difficulty.  Abdominal:     Palpations: Abdomen is soft. Abdomen is not rigid.     Tenderness: There is no abdominal tenderness. There is no guarding.  Musculoskeletal:        General: Normal range of motion.     Cervical back: Full passive range of motion without pain.  Skin:    General: Skin is warm and dry.     Capillary Refill: Capillary refill takes less than 2 seconds.  Neurological:     Mental Status: She is alert and oriented to person, place, and time.  Psychiatric:        Speech: Speech normal.    ED Results / Procedures / Treatments   Labs (all labs ordered are listed, but only abnormal results are displayed) Labs Reviewed  WET PREP, GENITAL -  Abnormal; Notable for the following components:      Result Value   WBC, Wet Prep HPF POC MODERATE (*)    All other components within normal limits  URINALYSIS, ROUTINE W REFLEX MICROSCOPIC - Abnormal; Notable for the following components:   APPearance HAZY (*)    Ketones, ur 5 (*)    All other components within normal limits  COMPREHENSIVE METABOLIC PANEL - Abnormal; Notable for the following components:   Glucose, Bld 237 (*)    All other components within normal limits  CBC WITH DIFFERENTIAL/PLATELET - Abnormal; Notable for the following components:   Lymphs Abs 0.3 (*)    Monocytes Absolute 0.0 (*)    All other components within normal limits  RESP PANEL BY RT-PCR (FLU A&B, COVID) ARPGX2  PREGNANCY, URINE  GC/CHLAMYDIA PROBE AMP ([place]) NOT AT [facility]    EKG None  Radiology No results found.  Procedures Procedures   Medications Ordered in ED Medications  famotidine (PEPCID) IVPB 20 mg premix (0 mg Intravenous Stopped 03/24/21 1700)  sodium chloride 0.9 % bolus 1,000 mL (0 mLs Intravenous Stopped 03/24/21 2104)  EPINEPHrine (EPI-PEN) injection 0.3 mg (0.3 mg Intramuscular Given 03/24/21 2031)  diphenhydrAMINE (BENADRYL) injection 25 mg (25 mg Intravenous Given 03/24/21 2039)  fentaNYL (SUBLIMAZE) injection 50 mcg (50 mcg Intravenous Given 03/24/21 2036)  ondansetron (ZOFRAN) injection 4 mg (4 mg Intravenous Given 03/24/21 2037)  albuterol (VENTOLIN HFA) 108 (90 Base) MCG/ACT inhaler 1-2 puff (2 puffs Inhalation Given 03/24/21 2049)    ED Course  I have reviewed the triage vital signs and the nursing notes.  Pertinent labs & imaging results that were available during my care of the patient were reviewed by me and considered in my medical decision making (see chart for details).    MDM Rules/Calculators/A&P                          35 year old female who presents for evaluation of allergic reaction.  Reports she was stung by a bee.  She has history of being stung previously and  having a reaction to it but states that occurred as a child.  She denies any difficulty breathing, vomiting, feeling of her throat was closing.  She went to urgent care and was given epi, Solu-Medrol and sent to the ED for further evaluation.  On initial arrival, she is afebrile, toxic appearing.  Vital signs are stable.  On exam, she has a noted bee sting noted just below the right lower lip.  No oral angioedema.  No difficulty breathing.  Patient also reports that she is having some vaginal irritation, vaginal discharge.  She reports history of BV's and states that this feels similar.  She has not any vaginal bleeding.  No vaginal rash, irritation.  She is also complaining some nasal congestion, rhinorrhea.  She recently flew to [facility] and states that made it worse.  On exam, she does have some bilateral erythematous, slightly edematous nasal turbinates.  Lungs clear to auscultation.  We will plan to monitor given that the EpiPen was given at approximately 1553.  I did offer patient pelvic exam versus swabbing.  Patient would like to self swab.   7:41 PM: I went to reevaluate patient and patient told me she felt like she was having tingling in her tongue as well as pain all over her body.  She also felt like in the back of her throat, she was becoming tight and felt like she was having some trouble breathing.  On exam, I did not hear any wheezing.  No oral angioedema noted.  Posterior pharynx is clear.  Will give additional EpiPen, Benadryl and reassess.  No evidence of respiratory distress.  No wheezing.  We will give her albuterol to see if that helps.  CMP shows glucose of 237.  CBC shows no leukocytosis or anemia.  Urine pregnancy is negative.  UA negative for infectious etiology.  Wet prep shows no clue cells.  Reevaluation.  Patient states she still feels off.  She states she feels scratchy throat but does not feel that she is in trouble breathing or swelling.  Patient right now does not require an  epi drip but given patient's history of requiring admission from bee sting as a child as well as requiring 2 EpiPen's here in the ED will plan for admission for observation.  Discussed patient with Dr. Dalene Carrow (hospitalist) who accepts patient for admission.   Portions of this note were generated with Scientist, clinical (histocompatibility and immunogenetics). Dictation errors may occur despite best attempts at proofreading.   Final Clinical Impression(s) / ED Diagnoses Final diagnoses:  Allergic reaction, initial encounter    Rx / DC Orders ED Discharge Orders     None        Rosana Hoes 03/24/21 2345    Benjiman Core, MD 03/24/21 [phone]

## 2021-03-24 NOTE — ED Triage Notes (Signed)
Pt present allergic reaction from a bee stingy. Symptoms started today. Pt states after she was stingy by the bee she begin to feel some blurred vision and tingling in her arms.

## 2021-03-25 ENCOUNTER — Encounter (HOSPITAL_COMMUNITY): Payer: Self-pay | Admitting: Internal Medicine

## 2021-03-25 DIAGNOSIS — T782XXA Anaphylactic shock, unspecified, initial encounter: Secondary | ICD-10-CM

## 2021-03-25 DIAGNOSIS — J302 Other seasonal allergic rhinitis: Secondary | ICD-10-CM | POA: Diagnosis not present

## 2021-03-25 DIAGNOSIS — T7840XA Allergy, unspecified, initial encounter: Secondary | ICD-10-CM

## 2021-03-25 DIAGNOSIS — T63441A Toxic effect of venom of bees, accidental (unintentional), initial encounter: Secondary | ICD-10-CM | POA: Diagnosis not present

## 2021-03-25 DIAGNOSIS — R110 Nausea: Secondary | ICD-10-CM | POA: Diagnosis present

## 2021-03-25 LAB — COMPREHENSIVE METABOLIC PANEL
ALT: 18 U/L (ref 0–44)
AST: 15 U/L (ref 15–41)
Albumin: 3.8 g/dL (ref 3.5–5.0)
Alkaline Phosphatase: 45 U/L (ref 38–126)
Anion gap: 9 (ref 5–15)
BUN: 8 mg/dL (ref 6–20)
CO2: 22 mmol/L (ref 22–32)
Calcium: 9.4 mg/dL (ref 8.9–10.3)
Chloride: 105 mmol/L (ref 98–111)
Creatinine, Ser: 0.59 mg/dL (ref 0.44–1.00)
GFR, Estimated: >60 mL/min (ref >60)
Glucose, Bld: 126 mg/dL — ABNORMAL HIGH (ref 70–99)
Potassium: 4.1 mmol/L (ref 3.5–5.1)
Sodium: 136 mmol/L (ref 135–145)
Total Bilirubin: 0.6 mg/dL (ref 0.3–1.2)
Total Protein: 6.6 g/dL (ref 6.5–8.1)

## 2021-03-25 LAB — MAGNESIUM
Magnesium: 1.9 mg/dL (ref 1.7–2.4)
Magnesium: 1.8 mg/dL (ref 1.7–2.4)

## 2021-03-25 LAB — RESP PANEL BY RT-PCR (FLU A&B, COVID) ARPGX2
Influenza A by PCR: NEGATIVE
Influenza B by PCR: NEGATIVE
SARS Coronavirus 2 by RT PCR: NEGATIVE

## 2021-03-25 LAB — CBC
HCT: 41.4 % (ref 36.0–46.0)
Hemoglobin: 13.2 g/dL (ref 12.0–15.0)
MCH: 26.7 pg (ref 26.0–34.0)
MCHC: 31.9 g/dL (ref 30.0–36.0)
MCV: 83.8 fL (ref 80.0–100.0)
Platelets: 282 10*3/uL (ref 150–400)
RBC: 4.94 MIL/uL (ref 3.87–5.11)
RDW: 13.4 % (ref 11.5–15.5)
WBC: 10.2 10*3/uL (ref 4.0–10.5)
nRBC: 0 % (ref 0.0–0.2)

## 2021-03-25 MED ORDER — EPINEPHRINE 0.3 MG/0.3ML IJ SOAJ: 0.3000 mg | INTRAMUSCULAR | 0 refills | Status: AC | PRN

## 2021-03-25 MED ORDER — CYCLOBENZAPRINE HCL 10 MG PO TABS
5.0000 mg | ORAL_TABLET | Freq: Once | ORAL | Status: AC
  Administered 2021-03-25: 5 mg via ORAL
  Filled 2021-03-25: qty 1

## 2021-03-25 MED ORDER — ALBUTEROL SULFATE HFA 108 (90 BASE) MCG/ACT IN AERS: 1.0000 | INHALATION_SPRAY | RESPIRATORY_TRACT | Status: DC | PRN

## 2021-03-25 MED ORDER — ALBUTEROL SULFATE (2.5 MG/3ML) 0.083% IN NEBU: 2.5000 mg | INHALATION_SOLUTION | RESPIRATORY_TRACT | Status: DC | PRN

## 2021-03-25 MED ORDER — DIPHENHYDRAMINE HCL 25 MG PO CAPS: 25.0000 mg | ORAL_CAPSULE | Freq: Four times a day (QID) | ORAL | 0 refills | Status: DC | PRN

## 2021-03-25 MED ORDER — CYCLOBENZAPRINE HCL 10 MG PO TABS: 5.0000 mg | ORAL_TABLET | Freq: Three times a day (TID) | ORAL | Status: DC | PRN

## 2021-03-25 MED ORDER — FAMOTIDINE 20 MG PO TABS: 20.0000 mg | ORAL_TABLET | Freq: Two times a day (BID) | ORAL | 0 refills | Status: DC

## 2021-03-25 MED ORDER — BACID PO TABS: 2.0000 | ORAL_TABLET | Freq: Two times a day (BID) | ORAL | 0 refills | Status: AC

## 2021-03-25 MED ORDER — CETIRIZINE HCL 10 MG PO TABS: 10.0000 mg | ORAL_TABLET | Freq: Every day | ORAL | 0 refills | Status: DC

## 2021-03-25 MED ORDER — IBUPROFEN 600 MG PO TABS: 800.0000 mg | ORAL_TABLET | Freq: Four times a day (QID) | ORAL | Status: DC | PRN

## 2021-03-25 MED ORDER — PREDNISONE 10 MG PO TABS: ORAL_TABLET | ORAL | 0 refills | Status: AC

## 2021-03-25 MED ORDER — IBUPROFEN 600 MG PO TABS: 800.0000 mg | ORAL_TABLET | Freq: Once | ORAL | Status: DC

## 2021-03-25 NOTE — ED Notes (Signed)
Unsuccessful attempt to give report nurse will call back in 5 minutes

## 2021-03-25 NOTE — ED Notes (Signed)
Report called to [facility] on 6e

## 2021-03-25 NOTE — ED Notes (Signed)
The pt is crying again  No idea  Why

## 2021-03-25 NOTE — Discharge Summary (Signed)
Physician Discharge Summary  Carol T Leap FHL:[id number] DOB: August 06, 1986 DOA: 03/24/2021  PCP: Patient, No Pcp Per (Inactive)  Admit date: 03/24/2021 Discharge date: 03/25/2021  Admitted From: Home Disposition: Home  Recommendations for Outpatient Follow-up:  Follow up with PCP in 1-2 weeks Discharge with EpiPen to use as needed for anaphylactic reaction Continue prednisone taper, Benadryl as needed, Pepcid for allergic reaction secondary to bee sting Started on Zyrtec for seasonal allergies Needs follow-up regarding chlamydia/gonorrhea PCR that was pending at time of discharge  Home Health: No Equipment/Devices: None  Discharge Condition: Stable CODE STATUS: Full code Diet recommendation: Regular diet  History of present illness:  Carol Potter is a 35 y.o. female with medical history significant for allergy to bee stings who is admitted to [facility] on 03/24/2021 with suspected allergic reaction to bee sting after presenting from home to [facility] ED complaining of lip swelling.    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  Did not require intubation.  She conveys that she has not been stung by any bees to her knowledge leading up to today, when, at approximately 1300, she was outside and was stung by a bee on her lip.  She reports that within a few minutes of the sting, that she developed lip swelling and tingling tingling sensation of her face as well as the sensation of throat fullness.  She denied any associated difficulty swallowing, but reported mild shortness of breath in the absence of any wheezing or stridor.  She also noted associated nausea in the absence of any vomiting, abdominal pain, diarrhea, or rash, including no associated hives.  In this capacity, she presented to local urgent care where she received  a single dose of an EpiPen, following which she reported improvement in her lip swelling, resolution of the tingling sensation associated with her face, as well as resolution of the sensation of throat fullness and shortness of breath.  Per recommendation from urgent care, the patient subsequently presented to [facility] emergency department for further evaluation and management of suspected allergic reaction in response to bee sting.   While in [facility] emergency department, she reports that she started to develop return of the previous mild tingling sensation associated with her face, but in the absence of any return of her lip swelling, throat fullness, or shortness of breath.  She also denied any associated nausea, vomiting, diarrhea, abdominal pain.  Denied any associated rash, including no urticaria.  Not associate with any wheezing or stridor.  Denies any associated chest pain palpitations, diaphoresis, dizziness, presyncope, or syncope.   In the setting of potential return of of tingling sensation associated with her face, the patient received a dose of epinephrine 0.3 mg IM at [place], representing the only dose that she has received at [facility] ED, and noted ensuing resolution of the tingling sensation associated with her face.  Subsequent to this administration of epinephrine at 1945, she reports that she has been completely asymptomatic.  However, in the context of her history of reported anaphylactic reaction to bee sting as a child requiring hospitalization and in the setting of a eating the aforementioned second dose of epinephrine, the hospitalist service was asked by the EDP to admit for overnight observation in the setting of presenting allergic reaction.    She denies any recent subjective  fever, chills, rigors, or generalized myalgias. Denies any recent headache, neck stiffness, rhinitis, rhinorrhea, sore throat, cough. No recent traveling or known COVID-19 exposures.   In the ED,  temperature 98.7, heart rate 70-91; blood pressure 112/81 -135/90; respiratory rate 16-20; oxygen saturation 95 to 98% on room air.  Sodium 137, potassium 3.8, bicarbonate 23, creatinine 0.89, and liver enzymes were found to be within normal limits.  Serum magnesium level 1.9.  WBC 6.6, hemoglobin 13.9.  Urinalysis showed no white blood cells, leukocyte Estrace negative, and nitrate negative.  Screening nasopharyngeal COVID-19/influenza PCR were checked in the ED today and found to be negative.  Patient received epinephrine 0.3 mg IM x1 at 1945, the patient also received Benadryl 25 mg IV x1, Zofran 4 mg IV x1, famotidine 20 mg IV x1, 1 L normal saline bolus and 2 puffs from an albuterol inhaler.  Subsequently, she was admitted to the med telemetry floor for overnight observation in the setting of suspected allergic reaction.  Hospital course:  Allergic reaction:  In the context of history of reported anaphylactic reaction to bee stings as a child, the patient's patients with allergic reaction after being stung by a bee around 1300 on 03/24/2021, with criteria for anaphylaxis appearing to have been met on the basis of ensuing lip swelling with respiratory involvement in the form of transient shortness of breath as well as gastrointestinal involvement in the form of transient nausea, with a timeframe of onset within a few minutes of the bee sting, consistent with an IgE mediated anaphylactic process.  Not associated with any hypotension to meet criteria for anaphylactic shock.  No evidence of associated stridor or respiratory distress.  After receiving a total of 2 doses of epinephrine today, the patient has been completely asymptomatic over the last 4 hours, and remains hemodynamically stable.  However, in the setting of her reported history of requiring hospitalization for an anaphylactic reaction as a child and in the setting of requiring 2 doses of epinephrine today, the patient was admitted for overnight  observation.  In addition to the 2 doses of epinephrine, the patient is also received IV Benadryl and IV famotidine.  Patient remained asymptomatic during hospitalization, able to tolerate her diet with no respiratory distress.  Stable for discharge home.  Will prescribe EpiPen to use as needed.  Pednisone taper x5 days with Pepcid.  Benadryl as needed.  Seasonal allergies Started on Zyrtec.  Vaginal irritation Patient reports sexually active with 1 sexual partner.  No vaginal bleeding.  Urinalysis with negative nitrite/leukocytes.  Wet prep done by EDP with no clue cells, trichomonas negative, no yeast.  GC/committee probe pending at time of discharge.  Discharge Diagnoses:  Active Problems:   * No active hospital problems. *    Discharge Instructions  Discharge Instructions     Call MD for:  difficulty breathing, headache or visual disturbances   Complete by: As directed    Call MD for:  extreme fatigue   Complete by: As directed    Call MD for:  persistant dizziness or light-headedness   Complete by: As directed    Call MD for:  persistant nausea and vomiting   Complete by: As directed    Call MD for:  severe uncontrolled pain   Complete by: As directed    Call MD for:  temperature >100.4   Complete by: As directed    Diet - low sodium heart healthy   Complete by: As directed    Increase activity slowly  Complete by: As directed       Allergies as of 03/25/2021       Reactions   Penicillins Itching   Vaginal Yeast infection   Bee Venom    Other Other (See Comments)   "Vaginal Yeast Infection after taking any antibiotic"        Medication List     STOP taking these medications    albuterol 108 (90 Base) MCG/ACT inhaler Commonly known as: VENTOLIN HFA   valACYclovir 1000 MG tablet Commonly known as: VALTREX       TAKE these medications    cetirizine 10 MG tablet Commonly known as: ZyrTEC Allergy Take 1 tablet (10 mg total) by mouth daily.    diphenhydrAMINE 25 mg capsule Commonly known as: Benadryl Allergy Take 1 capsule (25 mg total) by mouth every 6 (six) hours as needed for up to 5 days for allergies or itching.   EPINEPHrine 0.3 mg/0.3 mL Soaj injection Commonly known as: EPI-PEN Inject 0.3 mg into the muscle as needed for anaphylaxis.   famotidine 20 MG tablet Commonly known as: PEPCID Take 1 tablet (20 mg total) by mouth 2 (two) times daily for 5 days.   lactobacillus acidophilus Tabs tablet Take 2 tablets by mouth 2 (two) times daily.   meloxicam 7.5 MG tablet Commonly known as: Mobic Take 1 tablet (7.5 mg total) by mouth daily.   predniSONE 10 MG tablet Commonly known as: DELTASONE Take 4 tablets (40 mg total) by mouth daily for 1 day, THEN 3 tablets (30 mg total) daily for 1 day, THEN 2 tablets (20 mg total) daily for 1 day, THEN 1 tablet (10 mg total) daily for 1 day. Start taking on: March 25, 2021   simethicone 125 MG chewable tablet Commonly known as: MYLICON Chew 381 mg by mouth every 6 (six) hours as needed for flatulence.        Allergies  Allergen Reactions   Penicillins Itching    Vaginal Yeast infection   Bee Venom    Other Other (See Comments)    "Vaginal Yeast Infection after taking any antibiotic"    Consultations: None   Procedures/Studies: No results found.   Subjective: Patient seen and examined at bedside, resting comfortably.  Sleeping but easily arousable.  No complaints this morning.  Swelling to lips improved.  No issues with breathing and tolerating diet without issue.  Ready for discharge home.  Requesting update to her wet prep and STD testing, discussed with patient that her wet prep including trichomonas, yeast, and clue cells were negative.  GC and Chlamydia were pending and she needs to follow-up on results after discharge.  No other complaints or concerns at this time.  Denies headache, no visual changes, no chest pain, palpitations, no shortness of breath, no  abdominal pain, no weakness, no fatigue, no paresthesias.  No acute events overnight per nursing staff.  Discharge Exam: Vitals:   03/25/21 0300 03/25/21 0753  BP: 103/82 120/72  Pulse: (!) 54 68  Resp: 18 18  Temp: 98.5 F (36.9 C) 98.9 F (37.2 C)  SpO2:  97%   Vitals:   03/25/21 0116 03/25/21 0140 03/25/21 0300 03/25/21 0753  BP:  122/81 103/82 120/72  Pulse:  69 (!) 54 68  Resp:  _0 Temp: (!) 97.5 F (36.4 C) 98.1 F (36.7 C) 98.5 F (36.9 C) 98.9 F (37.2 C)  TempSrc:  Oral Oral Oral  SpO2:  97%  97%  Weight:  59.7 kg  Height:  5' (1.524 m)      General: Pt is alert, awake, not in acute distress Cardiovascular: RRR, S1/S2 +, no rubs, no gallops Respiratory: CTA bilaterally, no wheezing, no rhonchi, on room air Abdominal: Soft, NT, ND, bowel sounds + Extremities: no edema, no cyanosis    The results of significant diagnostics from this hospitalization (including imaging, microbiology, ancillary and laboratory) are listed below for reference.     Microbiology: Recent Results (from the past 240 hour(s))  Wet prep, genital     Status: Abnormal   Collection Time: 03/24/21  4:12 PM  Result Value Ref Range Status   Yeast Wet Prep HPF POC NONE SEEN NONE SEEN Final   Trich, Wet Prep NONE SEEN NONE SEEN Final   Clue Cells Wet Prep HPF POC NONE SEEN NONE SEEN Final   WBC, Wet Prep HPF POC MODERATE (A) NONE SEEN Final   Sperm NONE SEEN  Final    Comment: Performed at [facility], [place]. [address], [place], [place] [address]  Resp Panel by RT-PCR (Flu A&B, Covid) Nasopharyngeal Swab     Status: None   Collection Time: 03/24/21 11:31 PM   Specimen: Nasopharyngeal Swab; Nasopharyngeal(NP) swabs in vial transport medium  Result Value Ref Range Status   SARS Coronavirus 2 by RT PCR NEGATIVE NEGATIVE Final    Comment: (NOTE) SARS-CoV-2 target nucleic acids are NOT DETECTED.  The SARS-CoV-2 RNA is generally detectable in upper respiratory specimens during  the acute phase of infection. The lowest concentration of SARS-CoV-2 viral copies this assay can detect is 138 copies/mL. A negative result does not preclude SARS-Cov-2 infection and should not be used as the sole basis for treatment or other patient management decisions. A negative result may occur with  improper specimen collection/handling, submission of specimen other than nasopharyngeal swab, presence of viral mutation(s) within the areas targeted by this assay, and inadequate number of viral copies(<138 copies/mL). A negative result must be combined with clinical observations, patient history, and epidemiological information. The expected result is Negative.  Fact Sheet for Patients:  EntrepreneurPulse.com.au  Fact Sheet for Healthcare Providers:  IncredibleEmployment.be  This test is no t yet approved or cleared by the [place] FDA and  has been authorized for detection and/or diagnosis of SARS-CoV-2 by FDA under an Emergency Use Authorization (EUA). This EUA will remain  in effect (meaning this test can be used) for the duration of the COVID-19 declaration under Section 564(b)(1) of the Act, 21 U.S.C.section 360bbb-3(b)(1), unless the authorization is terminated  or revoked sooner.       Influenza A by PCR NEGATIVE NEGATIVE Final   Influenza B by PCR NEGATIVE NEGATIVE Final    Comment: (NOTE) The Xpert Xpress SARS-CoV-2/FLU/RSV plus assay is intended as an aid in the diagnosis of influenza from Nasopharyngeal swab specimens and should not be used as a sole basis for treatment. Nasal washings and aspirates are unacceptable for Xpert Xpress SARS-CoV-2/FLU/RSV testing.  Fact Sheet for Patients: EntrepreneurPulse.com.au  Fact Sheet for Healthcare Providers: IncredibleEmployment.be  This test is not yet approved or cleared by the [place] FDA and has been authorized for detection and/or  diagnosis of SARS-CoV-2 by FDA under an Emergency Use Authorization (EUA). This EUA will remain in effect (meaning this test can be used) for the duration of the COVID-19 declaration under Section 564(b)(1) of the Act, 21 U.S.C. section 360bbb-3(b)(1), unless the authorization is terminated or revoked.  Performed at [facility], [place] [address]  [place], [place] [address]      Labs: BNP (last 3 results) No results for input(s): BNP in the last 8760 hours. Basic Metabolic Panel: Recent Labs  Lab 03/24/21 2250 03/25/21 0139 03/25/21 0622  NA 137  --  136  K 3.8  --  4.1  CL 106  --  105  CO2 23  --  22  GLUCOSE 237*  --  126*  BUN 7  --  8  CREATININE 0.89  --  0.59  CALCIUM 9.0  --  9.4  MG  --  1.9 1.8   Liver Function Tests: Recent Labs  Lab 03/24/21 2250 03/25/21 0622  AST 20 15  ALT 19 18  ALKPHOS 50 45  BILITOT 0.6 0.6  PROT 6.5 6.6  ALBUMIN 3.9 3.8   No results for input(s): LIPASE, AMYLASE in the last 168 hours. No results for input(s): AMMONIA in the last 168 hours. CBC: Recent Labs  Lab 03/24/21 2250 03/25/21 0622  WBC 6.6 10.2  NEUTROABS 6.1  --   HGB 13.9 13.2  HCT 43.2 41.4  MCV 85.2 83.8  PLT 264 282   Cardiac Enzymes: No results for input(s): CKTOTAL, CKMB, CKMBINDEX, TROPONINI in the last 168 hours. BNP: Invalid input(s): POCBNP CBG: No results for input(s): GLUCAP in the last 168 hours. D-Dimer No results for input(s): DDIMER in the last 72 hours. Hgb A1c No results for input(s): HGBA1C in the last 72 hours. Lipid Profile No results for input(s): CHOL, HDL, LDLCALC, TRIG, CHOLHDL, LDLDIRECT in the last 72 hours. Thyroid function studies No results for input(s): TSH, T4TOTAL, T3FREE, THYROIDAB in the last 72 hours.  Invalid input(s): FREET3 Anemia work up No results for input(s): VITAMINB12, FOLATE, FERRITIN, TIBC, IRON, RETICCTPCT in the last 72 hours. Urinalysis    Component Value Date/Time   COLORURINE YELLOW  03/24/2021 1930   APPEARANCEUR HAZY (A) 03/24/2021 1930   LABSPEC 1.018 03/24/2021 1930   PHURINE 5.0 03/24/2021 1930   GLUCOSEU NEGATIVE 03/24/2021 1930   HGBUR NEGATIVE 03/24/2021 1930   BILIRUBINUR NEGATIVE 03/24/2021 1930   KETONESUR 5 (A) 03/24/2021 1930   PROTEINUR NEGATIVE 03/24/2021 1930   UROBILINOGEN 0.2 02/02/2021 1620   NITRITE NEGATIVE 03/24/2021 1930   LEUKOCYTESUR NEGATIVE 03/24/2021 1930   Sepsis Labs Invalid input(s): PROCALCITONIN,  WBC,  LACTICIDVEN Microbiology Recent Results (from the past 240 hour(s))  Wet prep, genital     Status: Abnormal   Collection Time: 03/24/21  4:12 PM  Result Value Ref Range Status   Yeast Wet Prep HPF POC NONE SEEN NONE SEEN Final   Trich, Wet Prep NONE SEEN NONE SEEN Final   Clue Cells Wet Prep HPF POC NONE SEEN NONE SEEN Final   WBC, Wet Prep HPF POC MODERATE (A) NONE SEEN Final   Sperm NONE SEEN  Final    Comment: Performed at [facility], [place] [address], [place], [place] [address]  Resp Panel by RT-PCR (Flu A&B, Covid) Nasopharyngeal Swab     Status: None   Collection Time: 03/24/21 11:31 PM   Specimen: Nasopharyngeal Swab; Nasopharyngeal(NP) swabs in vial transport medium  Result Value Ref Range Status   SARS Coronavirus 2 by RT PCR NEGATIVE NEGATIVE Final    Comment: (NOTE) SARS-CoV-2 target nucleic acids are NOT DETECTED.  The SARS-CoV-2 RNA is generally detectable in upper respiratory specimens during the acute phase of infection. The lowest concentration of SARS-CoV-2 viral copies this assay can detect is 138 copies/mL. A negative result does not preclude  SARS-Cov-2 infection and should not be used as the sole basis for treatment or other patient management decisions. A negative result may occur with  improper specimen collection/handling, submission of specimen other than nasopharyngeal swab, presence of viral mutation(s) within the areas targeted by this assay, and inadequate number of viral copies(<138  copies/mL). A negative result must be combined with clinical observations, patient history, and epidemiological information. The expected result is Negative.  Fact Sheet for Patients:  EntrepreneurPulse.com.au  Fact Sheet for Healthcare Providers:  IncredibleEmployment.be  This test is no t yet approved or cleared by the [place] FDA and  has been authorized for detection and/or diagnosis of SARS-CoV-2 by FDA under an Emergency Use Authorization (EUA). This EUA will remain  in effect (meaning this test can be used) for the duration of the COVID-19 declaration under Section 564(b)(1) of the Act, 21 U.S.C.section 360bbb-3(b)(1), unless the authorization is terminated  or revoked sooner.       Influenza A by PCR NEGATIVE NEGATIVE Final   Influenza B by PCR NEGATIVE NEGATIVE Final    Comment: (NOTE) The Xpert Xpress SARS-CoV-2/FLU/RSV plus assay is intended as an aid in the diagnosis of influenza from Nasopharyngeal swab specimens and should not be used as a sole basis for treatment. Nasal washings and aspirates are unacceptable for Xpert Xpress SARS-CoV-2/FLU/RSV testing.  Fact Sheet for Patients: EntrepreneurPulse.com.au  Fact Sheet for Healthcare Providers: IncredibleEmployment.be  This test is not yet approved or cleared by the [place] FDA and has been authorized for detection and/or diagnosis of SARS-CoV-2 by FDA under an Emergency Use Authorization (EUA). This EUA will remain in effect (meaning this test can be used) for the duration of the COVID-19 declaration under Section 564(b)(1) of the Act, 21 U.S.C. section 360bbb-3(b)(1), unless the authorization is terminated or revoked.  Performed at [facility], [place] [address], [place], [place] [address]      Time coordinating discharge: Over 30 minutes  SIGNED:   Zarayah Lanting J [place] DO  [facility] 03/25/2021, 9:16 AM

## 2021-03-25 NOTE — ED Notes (Signed)
The pt is on her cell phone.  She reports that she has an ice pack where she received the epi-pen lt thigh

## 2021-03-25 NOTE — TOC Initial Note (Signed)
Transition of Care (TOC) - Initial/Assessment Note  TOC team spoke to patient regarding the need for a primary care physician. Shared that a list would be provided to her to choose a doctor accepting her insurance. Explained to verify practice accepts her insurance upon making her 1st appointment. Patient expressed understanding. Will continue to monitor for any further needs.  Patient Details  Name: Carol Potter MRN: [id number] Date of Birth: 31-Aug-1986  Transition of Care [facility]) CM/SW Contact:    Dianna Limbo [place], [place] Phone Number: [phone] 03/25/2021, 9:00 AM  Clinical Narrative:                 Patient admitted with an anaphylactic reaction due to a bee sting to her lip. Spoke with patient on this morning. She is alert and verbally responsive.  Expected Discharge Plan: Home/Self Care Barriers to Discharge: No Barriers Identified   Patient Goals and CMS Choice Patient states their goals for this hospitalization and ongoing recovery are:: Recover from bee sting reaction and return home. CMS Medicare.gov Compare Post Acute Care list provided to:: Patient (Provided a list of physicians in her area.) Choice offered to / list presented to : Patient  Expected Discharge Plan and Services Expected Discharge Plan: Home/Self Care In-house Referral: Clinical Social Work Discharge Planning Services: CM Consult Post Acute Care Choice:  (Providers.) Living arrangements for the past 2 months: Single Family Home                 DME Arranged: N/A         HH Arranged: NA          Prior Living Arrangements/Services Living arrangements for the past 2 months: Single Family Home Lives with:: Self Patient language and need for interpreter reviewed:: No        Need for Family Participation in Patient Care: No (Comment)   Current home services:  (None) Criminal Activity/Legal Involvement Pertinent to Current Situation/Hospitalization: No - Comment as needed  Activities of  Daily Living Home Assistive Devices/Equipment: None ADL Screening (condition at time of admission) Patient's cognitive ability adequate to safely complete daily activities?: Yes Is the patient deaf or have difficulty hearing?: No Does the patient have difficulty seeing, even when wearing glasses/contacts?: No Does the patient have difficulty concentrating, remembering, or making decisions?: No Patient able to express need for assistance with ADLs?: Yes Does the patient have difficulty dressing or bathing?: No Independently performs ADLs?: Yes (appropriate for developmental age) Does the patient have difficulty walking or climbing stairs?: No Weakness of Legs: None Weakness of Arms/Hands: None  Permission Sought/Granted                  Emotional Assessment Appearance:: Appears stated age Attitude/Demeanor/Rapport: Engaged, Self-Confident Affect (typically observed): Appropriate Orientation: : Oriented to Self, Oriented to Place, Oriented to  Time, Oriented to Situation   Psych Involvement: No (comment)  Admission diagnosis:  Allergic reaction [T78.40XA] Allergic reaction, initial encounter [T78.40XA] Patient Active Problem List   Diagnosis Date Noted   Anaphylactic reaction 03/25/2021   Nausea 03/25/2021   Allergic reaction 03/24/2021   PCP:  Patient, No Pcp Per (Inactive) Pharmacy:   [id number] [place], [place] - [address] AT [facility] OF [address] [place] [address] Phone: [phone] Fax: [phone]     Social Determinants of Health (SDOH) Interventions    Readmission Risk Interventions No flowsheet data found.

## 2021-03-26 LAB — GC/CHLAMYDIA PROBE AMP (~~LOC~~) NOT AT ARMC
Chlamydia: NEGATIVE
Comment: NEGATIVE
Comment: NORMAL
Neisseria Gonorrhea: NEGATIVE

## 2021-03-28 ENCOUNTER — Encounter (HOSPITAL_COMMUNITY): Payer: Self-pay

## 2021-03-28 ENCOUNTER — Emergency Department (HOSPITAL_COMMUNITY): Payer: Medicare Other

## 2021-03-28 ENCOUNTER — Emergency Department (HOSPITAL_BASED_OUTPATIENT_CLINIC_OR_DEPARTMENT_OTHER): Payer: Medicare Other

## 2021-03-28 ENCOUNTER — Emergency Department (HOSPITAL_COMMUNITY)
Admission: EM | Admit: 2021-03-28 | Discharge: 2021-03-28 | Disposition: A | Discharge status: Home / Self Care | Payer: Medicare Other | Attending: Emergency Medicine | Admitting: Emergency Medicine

## 2021-03-28 ENCOUNTER — Other Ambulatory Visit: Payer: Self-pay

## 2021-03-28 DIAGNOSIS — Z9889 Other specified postprocedural states: Secondary | ICD-10-CM

## 2021-03-28 DIAGNOSIS — S59912A Unspecified injury of left forearm, initial encounter: Secondary | ICD-10-CM | POA: Diagnosis present

## 2021-03-28 DIAGNOSIS — Q681 Congenital deformity of finger(s) and hand: Secondary | ICD-10-CM | POA: Insufficient documentation

## 2021-03-28 DIAGNOSIS — M7989 Other specified soft tissue disorders: Secondary | ICD-10-CM

## 2021-03-28 DIAGNOSIS — M25871 Other specified joint disorders, right ankle and foot: Secondary | ICD-10-CM | POA: Diagnosis not present

## 2021-03-28 DIAGNOSIS — M79602 Pain in left arm: Secondary | ICD-10-CM | POA: Diagnosis not present

## 2021-03-28 DIAGNOSIS — S5012XA Contusion of left forearm, initial encounter: Secondary | ICD-10-CM | POA: Diagnosis not present

## 2021-03-28 DIAGNOSIS — M79604 Pain in right leg: Secondary | ICD-10-CM | POA: Diagnosis not present

## 2021-03-28 DIAGNOSIS — M25471 Effusion, right ankle: Secondary | ICD-10-CM

## 2021-03-28 DIAGNOSIS — X58XXXA Exposure to other specified factors, initial encounter: Secondary | ICD-10-CM | POA: Diagnosis not present

## 2021-03-28 DIAGNOSIS — Q738 Other reduction defects of unspecified limb(s): Secondary | ICD-10-CM

## 2021-03-28 HISTORY — DX: Unspecified osteoarthritis, unspecified site: M19.90

## 2021-03-28 LAB — CBC WITH DIFFERENTIAL/PLATELET
Abs Immature Granulocytes: 0.02 10*3/uL (ref 0.00–0.07)
Basophils Absolute: 0 10*3/uL (ref 0.0–0.1)
Basophils Relative: 1 %
Eosinophils Absolute: 0.1 10*3/uL (ref 0.0–0.5)
Eosinophils Relative: 1 %
HCT: 44.2 % (ref 36.0–46.0)
Hemoglobin: 14.1 g/dL (ref 12.0–15.0)
Immature Granulocytes: 0 %
Lymphocytes Relative: 34 %
Lymphs Abs: 2.9 10*3/uL (ref 0.7–4.0)
MCH: 27.2 pg (ref 26.0–34.0)
MCHC: 31.9 g/dL (ref 30.0–36.0)
MCV: 85.2 fL (ref 80.0–100.0)
Monocytes Absolute: 0.5 10*3/uL (ref 0.1–1.0)
Monocytes Relative: 6 %
Neutro Abs: 5.1 10*3/uL (ref 1.7–7.7)
Neutrophils Relative %: 58 %
Platelets: 293 10*3/uL (ref 150–400)
RBC: 5.19 MIL/uL — ABNORMAL HIGH (ref 3.87–5.11)
RDW: 13.5 % (ref 11.5–15.5)
WBC: 8.6 10*3/uL (ref 4.0–10.5)
nRBC: 0 % (ref 0.0–0.2)

## 2021-03-28 LAB — COMPREHENSIVE METABOLIC PANEL
ALT: 13 U/L (ref 0–44)
AST: 9 U/L — ABNORMAL LOW (ref 15–41)
Albumin: 4.1 g/dL (ref 3.5–5.0)
Alkaline Phosphatase: 47 U/L (ref 38–126)
Anion gap: 9 (ref 5–15)
BUN: 16 mg/dL (ref 6–20)
CO2: 25 mmol/L (ref 22–32)
Calcium: 9 mg/dL (ref 8.9–10.3)
Chloride: 103 mmol/L (ref 98–111)
Creatinine, Ser: 0.8 mg/dL (ref 0.44–1.00)
GFR, Estimated: >60 mL/min (ref >60)
Glucose, Bld: 99 mg/dL (ref 70–99)
Potassium: 3.4 mmol/L — ABNORMAL LOW (ref 3.5–5.1)
Sodium: 137 mmol/L (ref 135–145)
Total Bilirubin: 0.6 mg/dL (ref 0.3–1.2)
Total Protein: 7 g/dL (ref 6.5–8.1)

## 2021-03-28 LAB — SEDIMENTATION RATE: Sed Rate: 1 mm/hr (ref 0–22)

## 2021-03-28 LAB — CK: Total CK: 26 U/L — ABNORMAL LOW (ref 38–234)

## 2021-03-28 NOTE — Discharge Instructions (Addendum)
Office of Patient Experience: [phone] Please call to discuss your concerns.

## 2021-03-28 NOTE — ED Provider Notes (Signed)
Emergency Medicine Provider Triage Evaluation Note  Carol Potter , a 35 y.o. female  was evaluated in triage.  Pt complains of right leg swelling/pain and difficulty walking s/2 receiving epi injection into leg on 07/09.  Patient initially presented to urgent care with angioedema secondary to bee sting.  She was first provided with an IM injection of epinephrine in her right leg and transferred to the ED.  At that time she began having worsening symptoms and she received a second epi injection.  She was admitted to the hospital at that time.  She states that since receiving the first epi injection she has been "unable to walk" on her right leg.  She states that she is having pain and swelling in this leg and she feels like someone "hit a nerve."  Patient is also complaining of pain and swelling to her left arm from where her IV was placed.  He has not been taking anything for pain.  She states she "does not like taking medication."  She is currently on prednisone.  Review of Systems  Positive: + right leg pain, left arm pain Negative: - fevers, chills, weakness/numbness, tingling  Physical Exam  BP 113/84   Pulse 76   Temp 97.9 F (36.6 C) (Oral)   Resp 18   SpO2 99%  Gen:   Awake, no distress   Resp:  Normal effort  MSK:   Moves extremities without difficulty  Other:  No obvious swelling noted to RLE compared to LLE. + Diffuse TTP from posterior knee down to ankle. ROM intact to hip and knee on right side. ROM lmited s/2 pain to right ankle. 2+ DP pulse. Small healed surgical incision to R foot from previous injury. Strength and sensation intact.  + ecchymosis to left AC with swelling and TTP. 2+ radial pulse.   Medical Decision Making  Medically screening exam initiated at 5:17 PM.  Appropriate orders placed.  Carol T Tolles was informed that the remainder of the evaluation will be completed by another provider, this initial triage assessment does not replace that evaluation, and the  importance of remaining in the ED until their evaluation is complete.     Tanda Rockers, PA-C 03/28/21 1724    Koleen Distance, MD 03/28/21 [id number]

## 2021-03-28 NOTE — Progress Notes (Signed)
RLE and LUE venous duplexes have been completed.  Preliminary results given to Tanda Rockers, [place]

## 2021-03-28 NOTE — ED Provider Notes (Signed)
[facility] Provider Note   CSN: [id number] Arrival date & time: 03/28/21  1633     History Chief Complaint  Patient presents with   Right Leg Swelling    Carol Potter is a 35 y.o. female.  Carol Potter traces her inability to bear weight on her right lower extremity to receiving an epinephrine injection in the right anterior thigh.  This occurred on March 24, 2021 at [facility] urgent care.  She had been stung by a bee on her lower lip, and she presented to urgent care with swelling of the lower lip.  She was sent to the emergency department and actually had an observation hospitalization because she received 2 doses of epinephrine.  She also received an injection of steroids in her left buttocks, and she states that she has some diffuse left leg pain after that injection.  However, those symptoms resolved within 1 day.  However, she states that she has been limping ever since receiving this epinephrine injection.  She does have a history of having surgery on this foot at age 35 for a fallen arch.  She denies ever injuring this in the past.  She is also upset at bruising in her left cubital fossa.  Apparently, she had a very painful IV attempt by a nurse at urgent care.  She saw some blood spurting out when the needle was removed.  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  She states that the particular genetic condition that she has has only been seen in 3 other families by the geneticist that she saw in [place].  The history is provided by the patient.  Leg Pain Location:  Ankle and foot Injury: no   Ankle location:  R ankle Foot location:  Dorsum of R foot Pain details:    Quality:  Shooting   Radiates to:  R leg   Severity:  Severe   Onset quality:  Sudden   Duration:  4 days   Timing:  Constant   Progression:  Worsening Chronicity:  New Prior injury to area:  No Relieved  by:  Nothing Worsened by:  Bearing weight Ineffective treatments:  Rest Associated symptoms: decreased ROM and swelling   Associated symptoms: no back pain, no fever, no numbness and no tingling       Past Medical History:  Diagnosis Date   Arthritis    Brachydactyly    type C   Bronchitis     There are no problems to display for this patient.   Past Surgical History:  Procedure Laterality Date   arm surgery     foot surgery     HIP SURGERY       OB History   No obstetric history on file.     History reviewed. No pertinent family history.  Social History   Tobacco Use   Smoking status: Never   Smokeless tobacco: Never  Substance Use Topics   Drug use: Yes    Types: Marijuana    Home Medications Prior to Admission medications   Medication Sig Start Date End Date Taking? Authorizing Provider  cetirizine (ZYRTEC ALLERGY) 10 MG tablet Take 1 tablet (10 mg total) by mouth daily. 03/25/21 06/23/21  [place], Alvira Philips, DO  diphenhydrAMINE (BENADRYL ALLERGY) 25 mg capsule Take 1 capsule (25 mg total) by mouth every 6 (six) hours as needed for up to 5 days for allergies or itching. 03/25/21  03/30/21  [place], Alvira PhilipsEric J, DO  EPINEPHrine 0.3 mg/0.3 mL IJ SOAJ injection Inject 0.3 mg into the muscle as needed for anaphylaxis. 03/25/21   [place], Alvira PhilipsEric J, DO  famotidine (PEPCID) 20 MG tablet Take 1 tablet (20 mg total) by mouth 2 (two) times daily for 5 days. 03/25/21 03/30/21  [place], Alvira PhilipsEric J, DO  lactobacillus acidophilus (BACID) TABS tablet Take 2 tablets by mouth 2 (two) times daily. 03/25/21 04/24/21  [place], Alvira PhilipsEric J, DO  meloxicam (MOBIC) 7.5 MG tablet Take 1 tablet (7.5 mg total) by mouth daily. 11/27/20   Margarita Grizzleay, Danielle, MD  predniSONE (DELTASONE) 10 MG tablet Take 4 tablets (40 mg total) by mouth daily for 1 day, THEN 3 tablets (30 mg total) daily for 1 day, THEN 2 tablets (20 mg total) daily for 1 day, THEN 1 tablet (10 mg total) daily for 1 day. 03/25/21 03/29/21  [place], Alvira PhilipsEric J, DO   simethicone (MYLICON) 125 MG chewable tablet Chew 125 mg by mouth every 6 (six) hours as needed for flatulence.    [provider]    Allergies    Penicillins, Bee venom, and Other  Review of Systems   Review of Systems  Constitutional:  Negative for chills and fever.  HENT:  Negative for ear pain and sore throat.   Eyes:  Negative for pain and visual disturbance.  Respiratory:  Negative for cough and shortness of breath.   Cardiovascular:  Negative for chest pain and palpitations.  Gastrointestinal:  Negative for abdominal pain and vomiting.  Genitourinary:  Negative for dysuria and hematuria.  Musculoskeletal:  Positive for gait problem. Negative for arthralgias and back pain.  Skin:  Negative for color change and rash.  Neurological:  Negative for seizures and syncope.  All other systems reviewed and are negative.  Physical Exam Updated Vital Signs BP 137/82 (BP Location: Right Arm)   Pulse 64   Temp 97.9 F (36.6 C) (Oral)   Resp 18   SpO2 100%   Physical Exam Vitals and nursing note reviewed.  HENT:     Head: Normocephalic and atraumatic.  Eyes:     General: No scleral icterus. Pulmonary:     Effort: Pulmonary effort is normal. No respiratory distress.  Musculoskeletal:     Cervical back: Normal range of motion.     Comments: The right ankle is mildly and diffusely swollen especially at the lateral aspect of the ankle.  Range of motion is limited for all directions and is painful especially for dorsiflexion and plantarflexion.  There is diffuse tenderness to palpation at the anterior ankle joint and the lateral ankle joint.  Dorsalis pedis and posterior tibialis pulses are normal.  Sensation is intact.  She is able to bear weight but limps with full pressure on this extremity.  No perceptible wounds at the site of injection on either the left or the right thigh. No evidence of compartment syndrome. No erythema or signs of abscess.  Skin:    General: Skin is  warm and dry.     Findings: Bruising present.     Comments: Bruising at the left ac  Neurological:     General: No focal deficit present.     Mental Status: She is alert and oriented to person, place, and time.  Psychiatric:        Mood and Affect: Mood normal.         ED Results / Procedures / Treatments   Labs (all labs ordered are listed, but only abnormal  results are displayed) Labs Reviewed  CBC WITH DIFFERENTIAL/PLATELET - Abnormal; Notable for the following components:      Result Value   RBC 5.19 (*)    All other components within normal limits  COMPREHENSIVE METABOLIC PANEL - Abnormal; Notable for the following components:   Potassium 3.4 (*)    AST 9 (*)    All other components within normal limits  CK - Abnormal; Notable for the following components:   Total CK 26 (*)    All other components within normal limits  SEDIMENTATION RATE    EKG None  Radiology DG Ankle Complete Right  Result Date: 03/28/2021 CLINICAL DATA:  Atraumatic ankle pain EXAM: RIGHT ANKLE - COMPLETE 3+ VIEW COMPARISON:  Foot radiographs August 24, 2008 FINDINGS: There is no evidence of acute fracture or dislocation. Small joint effusion. Degenerative change of the ankle. Well corticated osseous fragment at the distal aspect of the medial malleolus favored represent sequela prior trauma. Irregularity of the distal lateral malleolus without discrete cortical break, also favored sequela of remote trauma. Soft tissue swelling about the ankle pain. IMPRESSION: 1. No evidence of acute osseous abnormality. 2. Well corticated osseous fragment at the distal aspect of medial malleolus and irregularity of the distal lateral malleolus without discrete cortical break, favored sequela of remote trauma. Correlation with point tenderness suggested. 3. Soft tissue swelling about the ankle with small joint effusion. Electronically Signed   By: Maudry Mayhew MD   On: 03/28/2021 20:25   DG Foot Complete  Right  Result Date: 03/28/2021 CLINICAL DATA:  Atraumatic foot pain EXAM: RIGHT FOOT COMPLETE - 3+ VIEW COMPARISON:  August 17, 2008 FINDINGS: There is no evidence of acute fracture or dislocation. Hallux valgus deformity. Well corticated osseous fragment partially visualized distal to the medial malleolus. Soft tissues are unremarkable. IMPRESSION: 1. No evidence of acute osseous abnormality in the right foot. 2. Hallux valgus deformity. Electronically Signed   By: Maudry Mayhew MD   On: 03/28/2021 20:21   VAS [place] LOWER EXTREMITY VENOUS (DVT) (ONLY [facility] & [facility] 7a-7p)  Result Date: 03/28/2021  Lower Venous DVT Study Patient Name:  Carol Potter  Date of Exam:   03/28/2021 Medical Rec #: [id number]         Accession #:    [id number] Date of Birth: Oct 01, 1985         Patient Gender: F Patient Age:   73Y Exam Location:  [facility] Procedure:      VAS [place] LOWER EXTREMITY VENOUS (DVT) Referring Phys: [id number] MARGAUX VENTER --------------------------------------------------------------------------------  Indications: Pain, and Swelling.  Comparison Study: No previous exams Performing Technologist: Jody Hill RVT, RDMS  Examination Guidelines: A complete evaluation includes B-mode imaging, spectral Doppler, color Doppler, and power Doppler as needed of all accessible portions of each vessel. Bilateral testing is considered an integral part of a complete examination. Limited examinations for reoccurring indications may be performed as noted. The reflux portion of the exam is performed with the patient in reverse Trendelenburg.  +---------+---------------+---------+-----------+----------+--------------+ RIGHT    CompressibilityPhasicitySpontaneityPropertiesThrombus Aging +---------+---------------+---------+-----------+----------+--------------+ CFV      Full           Yes      Yes                                 +---------+---------------+---------+-----------+----------+--------------+ SFJ       Full                                                        +---------+---------------+---------+-----------+----------+--------------+  FV Prox  Full           Yes      Yes                                 +---------+---------------+---------+-----------+----------+--------------+ FV Mid   Full           Yes      Yes                                 +---------+---------------+---------+-----------+----------+--------------+ FV DistalFull           Yes      Yes                                 +---------+---------------+---------+-----------+----------+--------------+ PFV      Full                                                        +---------+---------------+---------+-----------+----------+--------------+ POP      Full           Yes      Yes                                 +---------+---------------+---------+-----------+----------+--------------+ PTV      Full                                                        +---------+---------------+---------+-----------+----------+--------------+ PERO     Full                                                        +---------+---------------+---------+-----------+----------+--------------+   +----+---------------+---------+-----------+----------+--------------+ LEFTCompressibilityPhasicitySpontaneityPropertiesThrombus Aging +----+---------------+---------+-----------+----------+--------------+ CFV Full           Yes      Yes                                 +----+---------------+---------+-----------+----------+--------------+     Summary: RIGHT: - There is no evidence of deep vein thrombosis in the lower extremity. - There is no evidence of superficial venous thrombosis.  - No cystic structure found in the popliteal fossa.  LEFT: - No evidence of common femoral vein obstruction.  *See table(s) above for measurements and observations. Electronically signed by Heath Lark on 03/28/2021 at 7:23:12 PM.     Final    UE Venous Duplex ([facility] and [facility] ONLY)  Result Date: 03/28/2021 UPPER VENOUS STUDY  Patient Name:  Carol Potter  Date of Exam:   03/28/2021 Medical Rec #: [id number]         Accession #:    [id number] Date of Birth: 21-Jan-1986         Patient Gender: F Patient  Age:   60Y Exam Location:  [facility] Procedure:      VAS [place] UPPER EXTREMITY VENOUS DUPLEX Referring Phys: [id number] MARGAUX VENTER --------------------------------------------------------------------------------  Indications: Pain and swelling post attempted IV placement Comparison Study: No previous exams Performing Technologist: Jody Hill RVT, RDMS  Examination Guidelines: A complete evaluation includes B-mode imaging, spectral Doppler, color Doppler, and power Doppler as needed of all accessible portions of each vessel. Bilateral testing is considered an integral part of a complete examination. Limited examinations for reoccurring indications may be performed as noted.  Right Findings: +----------+------------+---------+-----------+----------+-------+ RIGHT     CompressiblePhasicitySpontaneousPropertiesSummary +----------+------------+---------+-----------+----------+-------+ Subclavian    Full       Yes       Yes                      +----------+------------+---------+-----------+----------+-------+  Left Findings: +----------+------------+---------+-----------+----------+-------+ LEFT      CompressiblePhasicitySpontaneousPropertiesSummary +----------+------------+---------+-----------+----------+-------+ IJV           Full       Yes       Yes                      +----------+------------+---------+-----------+----------+-------+ Subclavian    Full       Yes       Yes                      +----------+------------+---------+-----------+----------+-------+ Axillary      Full       Yes       Yes                      +----------+------------+---------+-----------+----------+-------+ Brachial       Full       Yes       Yes                      +----------+------------+---------+-----------+----------+-------+ Radial        Full                                          +----------+------------+---------+-----------+----------+-------+ Ulnar         Full                                          +----------+------------+---------+-----------+----------+-------+ Cephalic      Full                                          +----------+------------+---------+-----------+----------+-------+ Basilic       Full       Yes       Yes                      +----------+------------+---------+-----------+----------+-------+  Summary:  Right: No evidence of thrombosis in the subclavian.  Left: No evidence of deep vein thrombosis in the upper extremity. No evidence of superficial vein thrombosis in the upper extremity.  *See table(s) above for measurements and observations.  Diagnosing physician: Heath Lark Electronically signed by Heath Lark on 03/28/2021 at 7:23:22 PM.    Final     Procedures Procedures   Medications Ordered  in ED Medications - No data to display  ED Course  I have reviewed the triage vital signs and the nursing notes.  Pertinent labs & imaging results that were available during my care of the patient were reviewed by me and considered in my medical decision making (see chart for details).    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  She is disabled from this syndrome.  4 days ago, she was stung by a bee and was admitted to the hospital for observation secondary to receiving 2 doses of epinephrine.  She states that after receiving a dose of epinephrine in her right anterior thigh, she has had the inability to walk.  Initially it was on her right leg and her left leg, but it is now only her right leg.  She has had swelling of her ankle and foot that is unusual,  and it has not responded to conservative management.  She was evaluated here for evidence of cellulitis, hematoma, rhabdomyolysis, DVT, arthritis, occult bony injury.  Her ankle exam and her x-ray suggest likely impingement with some irritation of her joint.  This is likely secondary to her genetic syndrome, and appears chronic.  I am unsure how her exacerbation is connected to her receiving epinephrine, but she is very clear in the time course.  I have encouraged her to follow-up with orthopedics and/or neurology.  She remains convinced that there is something wrong with her leg, and she was requesting nerve testing.  She has no nerve deficits, and I told her that any further testing to elucidate neurologic pathology would have to come from a neurologist and in the outpatient setting.  At this point, she has no evidence of an emergent orthopedic, neurologic, or vascular condition.  I did encourage her to consult the office the patient experience.  She was reassured and given instructions on symptomatic management.  I recommended an ASO brace.  She declined all pain management measures.  She was discharged home with recommendations detailed above.  At 1 point, she also stated that she was going to obtain a lawyer and requested that I document pictures.  She wanted me to print out her entire medical record, but I told her that this would have to come from medical records.  She states that she does have access to her online medical record, and I asked her to print those pictures from that record. Final Clinical Impression(s) / ED Diagnoses Final diagnoses:  Right ankle swelling  Impingement syndrome of right ankle  Brachydactyly    Rx / DC Orders ED Discharge Orders     None        Koleen Distance, MD 03/28/21 2216

## 2021-03-28 NOTE — ED Triage Notes (Signed)
Pt states she was stung in her mouth by a bee 7/9. Pt was seen at [facility] and given Epipen and steroid. Pt states she was seen in the ED and given more meds, including another Epipen. Pt states she cannot walk on her right leg, right leg is swollen, right ankle is swollen. Pt states her left arm is also sore and bruising and she wants it looked at.

## 2021-06-06 ENCOUNTER — Encounter (HOSPITAL_COMMUNITY): Payer: Self-pay

## 2021-06-06 ENCOUNTER — Emergency Department (HOSPITAL_COMMUNITY)
Admission: EM | Admit: 2021-06-06 | Discharge: 2021-06-06 | Disposition: A | Discharge status: Home / Self Care | Payer: Medicare Other | Attending: Emergency Medicine | Admitting: Emergency Medicine

## 2021-06-06 ENCOUNTER — Other Ambulatory Visit: Payer: Self-pay

## 2021-06-06 DIAGNOSIS — N939 Abnormal uterine and vaginal bleeding, unspecified: Secondary | ICD-10-CM | POA: Diagnosis not present

## 2021-06-06 DIAGNOSIS — N898 Other specified noninflammatory disorders of vagina: Secondary | ICD-10-CM | POA: Diagnosis not present

## 2021-06-06 LAB — WET PREP, GENITAL
Clue Cells Wet Prep HPF POC: NONE SEEN
Sperm: NONE SEEN
Trich, Wet Prep: NONE SEEN
Yeast Wet Prep HPF POC: NONE SEEN

## 2021-06-06 LAB — URINALYSIS, ROUTINE W REFLEX MICROSCOPIC
Bilirubin Urine: NEGATIVE
Glucose, UA: NEGATIVE mg/dL
Hgb urine dipstick: NEGATIVE
Ketones, ur: NEGATIVE mg/dL
Leukocytes,Ua: NEGATIVE
Nitrite: NEGATIVE
Protein, ur: NEGATIVE mg/dL
Specific Gravity, Urine: >1.03 — ABNORMAL HIGH (ref 1.005–1.030)
pH: 6 (ref 5.0–8.0)

## 2021-06-06 LAB — POC URINE PREG, ED: Preg Test, Ur: NEGATIVE

## 2021-06-06 NOTE — Discharge Instructions (Signed)
Stop using the new soap and return back to using the soap you have used before. I recommend you schedule appoint with OB/GYN.  Call the office listed below to set up a follow-up appointment. Your gonorrhea and committee tests are pending.  If positive, you should receive a phone call.  You may also check online on MyChart.  If positive, you will need to be treated and let your partner know. Return to the emergency room if you develop fever, abdominal pain, persistent vomiting, or any new, worsening, or concerning symptoms.

## 2021-06-06 NOTE — ED Triage Notes (Signed)
Pt presents with c/o vaginal itching and discharge. Pt reports symptoms present for 2 days. Pt also reports a "fishy" odor. Pt reports the discharge is white and thick.

## 2021-06-06 NOTE — ED Provider Notes (Signed)
[facility]-EMERGENCY DEPT Provider Note   CSN: [id number] Arrival date & time: 06/06/21  4580     History Chief Complaint  Patient presents with   Vaginal Discharge    Carol Potter is a 35 y.o. female presenting for evaluation of vaginal discharge and bleeding.  Factors the past 2 days she has had vaginal itch itching and discharge.  She describes the discharge as thick and white with a fishy odor.  Today she had some vaginal bleeding when wiping.  She has been using boric acid suppositories. She switched to a new (and scented) soap 4 days ago.  Her last period was beginning of this month, was not her normal time.  She is sexually active with 1 female partner, has her tubes tied.  Does not use condoms.  He is symptom-free.  She has fevers, chills, nausea, vomiting, abd  pain.  She denies to dysuria, hematuria, urinary frequency.  No other medical problems. Does not have an ob/gyn   HPI     Past Medical History:  Diagnosis Date   Arthritis    Brachydactyly    type C   Bronchitis     There are no problems to display for this patient.   Past Surgical History:  Procedure Laterality Date   arm surgery     foot surgery     HIP SURGERY       OB History   No obstetric history on file.     History reviewed. No pertinent family history.  Social History   Tobacco Use   Smoking status: Never   Smokeless tobacco: Never  Substance Use Topics   Drug use: Yes    Types: Marijuana    Home Medications Prior to Admission medications   Medication Sig Start Date End Date Taking? Authorizing Provider  cetirizine (ZYRTEC ALLERGY) 10 MG tablet Take 1 tablet (10 mg total) by mouth daily. 03/25/21 06/23/21  [place], Alvira Philips, DO  diphenhydrAMINE (BENADRYL ALLERGY) 25 mg capsule Take 1 capsule (25 mg total) by mouth every 6 (six) hours as needed for up to 5 days for allergies or itching. 03/25/21 03/30/21  [place], Alvira Philips, DO  EPINEPHrine 0.3 mg/0.3 mL IJ SOAJ  injection Inject 0.3 mg into the muscle as needed for anaphylaxis. 03/25/21   [place], Alvira Philips, DO  famotidine (PEPCID) 20 MG tablet Take 1 tablet (20 mg total) by mouth 2 (two) times daily for 5 days. 03/25/21 03/30/21  [place], Alvira Philips, DO  meloxicam (MOBIC) 7.5 MG tablet Take 1 tablet (7.5 mg total) by mouth daily. 11/27/20   Margarita Grizzle, MD  simethicone (MYLICON) 125 MG chewable tablet Chew 125 mg by mouth every 6 (six) hours as needed for flatulence.    [provider]    Allergies    Penicillins, Bee venom, and Other  Review of Systems   Review of Systems  Genitourinary:  Positive for vaginal bleeding and vaginal discharge.  All other systems reviewed and are negative.  Physical Exam Updated Vital Signs BP 93/64 (BP Location: Right Arm)   Pulse 61   Temp 97.8 F (36.6 C) (Oral)   Resp 16   LMP 05/17/2021 (Approximate)   SpO2 99%   Physical Exam Vitals and nursing note reviewed. Exam conducted with a chaperone present.  Constitutional:      General: She is not in acute distress.    Appearance: Normal appearance.     Comments: Resting in the bed in nad  HENT:  Head: Normocephalic and atraumatic.  Eyes:     Conjunctiva/sclera: Conjunctivae normal.     Pupils: Pupils are equal, round, and reactive to light.  Cardiovascular:     Rate and Rhythm: Normal rate and regular rhythm.     Pulses: Normal pulses.  Pulmonary:     Effort: Pulmonary effort is normal. No respiratory distress.     Breath sounds: Normal breath sounds. No wheezing.     Comments: Speaking in full sentences.  Clear lung sounds in all fields. Abdominal:     General: There is no distension.     Palpations: Abdomen is soft. There is no mass.     Tenderness: There is no abdominal tenderness. There is no guarding or rebound.     Comments: No ttp of the abd  Genitourinary:    Comments: No discharge noted on my exam.  Minimal bleeding coming from the cervix.  No friability.  No CMT or adnexal  tenderness. Musculoskeletal:        General: Normal range of motion.     Cervical back: Normal range of motion and neck supple.  Skin:    General: Skin is warm and dry.     Capillary Refill: Capillary refill takes less than 2 seconds.  Neurological:     Mental Status: She is alert and oriented to person, place, and time.  Psychiatric:        Mood and Affect: Mood and affect normal.        Speech: Speech normal.        Behavior: Behavior normal.    ED Results / Procedures / Treatments   Labs (all labs ordered are listed, but only abnormal results are displayed) Labs Reviewed  WET PREP, GENITAL - Abnormal; Notable for the following components:      Result Value   WBC, Wet Prep HPF POC FEW (*)    All other components within normal limits  URINALYSIS, ROUTINE W REFLEX MICROSCOPIC - Abnormal; Notable for the following components:   Specific Gravity, Urine >1.030 (*)    All other components within normal limits  POC URINE PREG, ED  GC/CHLAMYDIA PROBE AMP ([place]) NOT AT [facility]    EKG None  Radiology No results found.  Procedures Procedures   Medications Ordered in ED Medications - No data to display  ED Course  I have reviewed the triage vital signs and the nursing notes.  Pertinent labs & imaging results that were available during my care of the patient were reviewed by me and considered in my medical decision making (see chart for details).    MDM Rules/Calculators/A&P                           Patient presenting for evaluation of vaginal discharge and vaginal bleeding.  On exam, patient appears nontoxic.  No abdominal pain.  Vital signs are reassuring, not consistent with sepsis.  GYN exam does not show any discharge, instead some very minimal vaginal bleeding. Wet prep pending. UA and pregnancy negative.   Wet prep negative for yeast, trichomoniasis, or clue cells.  Does show a few white cells.  Patient is aware that gonorrhea and Chlamydia are pending.  However  without discharge currently, will hold off on treatment.  Discussed findings with patient.  Discussed likely irregular period and/or irritation due to using a new soap.  Encouraged patient to go back to her original soap.  Encouraged her to follow-up with OB/GYN, resources given.  At this time, patient appears safe for discharge.  Return precautions given.  Patient states she understands and agrees to plan  Final Clinical Impression(s) / ED Diagnoses Final diagnoses:  Vaginal discharge  Vaginal bleeding    Rx / DC Orders ED Discharge Orders     None        Alveria Apley, PA-C 06/06/21 1224    Terald Sleeper, MD 06/06/21 1701

## 2021-06-07 LAB — GC/CHLAMYDIA PROBE AMP (~~LOC~~) NOT AT ARMC
Chlamydia: NEGATIVE
Comment: NEGATIVE
Comment: NORMAL
Neisseria Gonorrhea: NEGATIVE

## 2021-06-08 ENCOUNTER — Emergency Department (HOSPITAL_COMMUNITY): Payer: Medicare Other

## 2021-06-08 ENCOUNTER — Other Ambulatory Visit: Payer: Self-pay

## 2021-06-08 ENCOUNTER — Encounter (HOSPITAL_COMMUNITY): Payer: Self-pay

## 2021-06-08 ENCOUNTER — Emergency Department (HOSPITAL_COMMUNITY)
Admission: EM | Admit: 2021-06-08 | Discharge: 2021-06-08 | Disposition: A | Discharge status: Home / Self Care | Payer: Medicare Other | Attending: Emergency Medicine | Admitting: Emergency Medicine

## 2021-06-08 DIAGNOSIS — R112 Nausea with vomiting, unspecified: Secondary | ICD-10-CM | POA: Insufficient documentation

## 2021-06-08 DIAGNOSIS — N800 Endometriosis of uterus: Secondary | ICD-10-CM | POA: Insufficient documentation

## 2021-06-08 DIAGNOSIS — N8003 Adenomyosis of the uterus: Secondary | ICD-10-CM

## 2021-06-08 DIAGNOSIS — N898 Other specified noninflammatory disorders of vagina: Secondary | ICD-10-CM | POA: Diagnosis present

## 2021-06-08 LAB — CBC WITH DIFFERENTIAL/PLATELET
Abs Immature Granulocytes: 0.02 10*3/uL (ref 0.00–0.07)
Basophils Absolute: 0 10*3/uL (ref 0.0–0.1)
Basophils Relative: 0 %
Eosinophils Absolute: 0 10*3/uL (ref 0.0–0.5)
Eosinophils Relative: 0 %
HCT: 42 % (ref 36.0–46.0)
Hemoglobin: 13.7 g/dL (ref 12.0–15.0)
Immature Granulocytes: 0 %
Lymphocytes Relative: 10 %
Lymphs Abs: 0.6 10*3/uL — ABNORMAL LOW (ref 0.7–4.0)
MCH: 27.3 pg (ref 26.0–34.0)
MCHC: 32.6 g/dL (ref 30.0–36.0)
MCV: 83.8 fL (ref 80.0–100.0)
Monocytes Absolute: 0.2 10*3/uL (ref 0.1–1.0)
Monocytes Relative: 3 %
Neutro Abs: 5.4 10*3/uL (ref 1.7–7.7)
Neutrophils Relative %: 87 %
Platelets: 251 10*3/uL (ref 150–400)
RBC: 5.01 MIL/uL (ref 3.87–5.11)
RDW: 13.2 % (ref 11.5–15.5)
WBC: 6.2 10*3/uL (ref 4.0–10.5)
nRBC: 0 % (ref 0.0–0.2)

## 2021-06-08 LAB — URINALYSIS, ROUTINE W REFLEX MICROSCOPIC
Bilirubin Urine: NEGATIVE
Glucose, UA: NEGATIVE mg/dL
Hgb urine dipstick: NEGATIVE
Ketones, ur: NEGATIVE mg/dL
Nitrite: NEGATIVE
Protein, ur: 30 mg/dL — AB
Specific Gravity, Urine: 1.014 (ref 1.005–1.030)
pH: 6 (ref 5.0–8.0)

## 2021-06-08 LAB — COMPREHENSIVE METABOLIC PANEL
ALT: 9 U/L (ref 0–44)
AST: 13 U/L — ABNORMAL LOW (ref 15–41)
Albumin: 4.6 g/dL (ref 3.5–5.0)
Alkaline Phosphatase: 51 U/L (ref 38–126)
Anion gap: 10 (ref 5–15)
BUN: 10 mg/dL (ref 6–20)
CO2: 24 mmol/L (ref 22–32)
Calcium: 9.7 mg/dL (ref 8.9–10.3)
Chloride: 105 mmol/L (ref 98–111)
Creatinine, Ser: 0.73 mg/dL (ref 0.44–1.00)
GFR, Estimated: >60 mL/min (ref >60)
Glucose, Bld: 144 mg/dL — ABNORMAL HIGH (ref 70–99)
Potassium: 3.6 mmol/L (ref 3.5–5.1)
Sodium: 139 mmol/L (ref 135–145)
Total Bilirubin: 0.8 mg/dL (ref 0.3–1.2)
Total Protein: 7.6 g/dL (ref 6.5–8.1)

## 2021-06-08 LAB — I-STAT BETA HCG BLOOD, ED (MC, WL, AP ONLY): I-stat hCG, quantitative: <5 m[IU]/mL (ref <5)

## 2021-06-08 LAB — LIPASE, BLOOD: Lipase: 31 U/L (ref 11–51)

## 2021-06-08 MED ORDER — SODIUM CHLORIDE 0.9 % IV BOLUS (SEPSIS)
1000.0000 mL | Freq: Once | INTRAVENOUS | Status: AC
  Administered 2021-06-08: 1000 mL via INTRAVENOUS

## 2021-06-08 MED ORDER — SODIUM CHLORIDE 0.9 % IV SOLN
1000.0000 mL | INTRAVENOUS | Status: DC
  Administered 2021-06-08: 1000 mL via INTRAVENOUS

## 2021-06-08 MED ORDER — IOHEXOL 350 MG/ML SOLN
80.0000 mL | Freq: Once | INTRAVENOUS | Status: AC | PRN
  Administered 2021-06-08: 80 mL via INTRAVENOUS

## 2021-06-08 MED ORDER — IBUPROFEN 600 MG PO TABS: 600.0000 mg | ORAL_TABLET | Freq: Three times a day (TID) | ORAL | 0 refills | Status: AC | PRN

## 2021-06-08 MED ORDER — ONDANSETRON HCL 4 MG/2ML IJ SOLN
4.0000 mg | Freq: Once | INTRAMUSCULAR | Status: AC
  Administered 2021-06-08: 4 mg via INTRAVENOUS
  Filled 2021-06-08: qty 2

## 2021-06-08 MED ORDER — KETOROLAC TROMETHAMINE 30 MG/ML IJ SOLN
30.0000 mg | Freq: Once | INTRAMUSCULAR | Status: AC
  Administered 2021-06-08: 30 mg via INTRAVENOUS
  Filled 2021-06-08: qty 1

## 2021-06-08 MED ORDER — ONDANSETRON 8 MG PO TBDP: 8.0000 mg | ORAL_TABLET | Freq: Three times a day (TID) | ORAL | 0 refills | Status: DC | PRN

## 2021-06-08 MED ORDER — MORPHINE SULFATE (PF) 4 MG/ML IV SOLN
4.0000 mg | Freq: Once | INTRAVENOUS | Status: AC
Start: 2021-06-08 — End: 2021-06-08
  Administered 2021-06-08: 4 mg via INTRAVENOUS
  Filled 2021-06-08: qty 1

## 2021-06-08 NOTE — ED Provider Notes (Signed)
[facility]-EMERGENCY DEPT Provider Note   CSN: [id number] Arrival date & time: 06/08/21  0433     History Chief Complaint  Patient presents with   Abdominal Pain   Vaginal Bleeding   Nausea   Emesis   Flank Pain    Carol Potter is a 35 y.o. female.  HPI  2 days ago the patient started having some issues with vaginal discharge as well as some irregular bleeding.  She was not having any pain or discomfort.  She came to the emergency room and had an evaluation including a pelvic exam.  Exam was reassuring and she was instructed to follow-up with OB/GYN. Pt started having pain in her abdomen yesterday.  More in the lower abdomen.  She then started to have aching in her back and shoulders.  She felt weak and felt very bloated.  She tried taking gasx without relief.  She started having more severe pain going into her back.  She felt like she was going to pass out.  She was given medications by EMS and improved slightly but it has not resolved. Past Medical History:  Diagnosis Date   Arthritis    Brachydactyly    type C   Bronchitis     There are no problems to display for this patient.   Past Surgical History:  Procedure Laterality Date   arm surgery     foot surgery     HIP SURGERY       OB History   No obstetric history on file.     History reviewed. No pertinent family history.  Social History   Tobacco Use   Smoking status: Never   Smokeless tobacco: Never  Substance Use Topics   Drug use: Yes    Types: Marijuana    Home Medications Prior to Admission medications   Medication Sig Start Date End Date Taking? Authorizing Provider  EPINEPHrine 0.3 mg/0.3 mL IJ SOAJ injection Inject 0.3 mg into the muscle as needed for anaphylaxis. 03/25/21  Yes [place], Eric J, DO  ibuprofen (ADVIL) 600 MG tablet Take 1 tablet (600 mg total) by mouth every 8 (eight) hours as needed for up to 7 days for fever, headache, mild pain or moderate pain.  06/08/21 06/15/21 Yes Linwood Dibbles, MD  ondansetron (ZOFRAN ODT) 8 MG disintegrating tablet Take 1 tablet (8 mg total) by mouth every 8 (eight) hours as needed for nausea or vomiting. 06/08/21  Yes Linwood Dibbles, MD    Allergies    Penicillins, Bee venom, and Other  Review of Systems   Review of Systems  All other systems reviewed and are negative.  Physical Exam Updated Vital Signs BP 111/72   Pulse 74   Temp 98.5 F (36.9 C) (Oral)   Resp 18   LMP 05/17/2021 (Approximate) Comment: neg preg test 9.23.2022  SpO2 98%   Physical Exam Vitals and nursing note reviewed.  Constitutional:      General: She is not in acute distress.    Appearance: She is well-developed.  HENT:     Head: Normocephalic and atraumatic.     Right Ear: External ear normal.     Left Ear: External ear normal.  Eyes:     General: No scleral icterus.       Right eye: No discharge.        Left eye: No discharge.     Conjunctiva/sclera: Conjunctivae normal.  Neck:     Trachea: No tracheal deviation.  Cardiovascular:  Rate and Rhythm: Normal rate and regular rhythm.  Pulmonary:     Effort: Pulmonary effort is normal. No respiratory distress.     Breath sounds: Normal breath sounds. No stridor. No wheezing or rales.  Abdominal:     General: Bowel sounds are normal. There is no distension.     Palpations: Abdomen is soft.     Tenderness: There is abdominal tenderness in the right lower quadrant. There is no guarding or rebound.  Musculoskeletal:        General: No tenderness or deformity.     Cervical back: Neck supple.  Skin:    General: Skin is warm and dry.     Findings: No rash.  Neurological:     General: No focal deficit present.     Mental Status: She is alert.     Cranial Nerves: No cranial nerve deficit (no facial droop, extraocular movements intact, no slurred speech).     Sensory: No sensory deficit.     Motor: No abnormal muscle tone or seizure activity.     Coordination: Coordination  normal.  Psychiatric:        Mood and Affect: Mood normal.    ED Results / Procedures / Treatments   Labs (all labs ordered are listed, but only abnormal results are displayed) Labs Reviewed  CBC WITH DIFFERENTIAL/PLATELET - Abnormal; Notable for the following components:      Result Value   Lymphs Abs 0.6 (*)    All other components within normal limits  COMPREHENSIVE METABOLIC PANEL - Abnormal; Notable for the following components:   Glucose, Bld 144 (*)    AST 13 (*)    All other components within normal limits  URINALYSIS, ROUTINE W REFLEX MICROSCOPIC - Abnormal; Notable for the following components:   APPearance HAZY (*)    Protein, ur 30 (*)    Leukocytes,Ua MODERATE (*)    Bacteria, UA RARE (*)    All other components within normal limits  LIPASE, BLOOD  I-STAT BETA HCG BLOOD, ED ([facility], [facility], [facility] ONLY)    EKG None  Radiology US Transvaginal Non-OB  Result Date: 06/08/2021 CLINICAL DATA:  Pelvic pain and bleeding; LMP 06/07/2021 EXAM: TRANSABDOMINAL AND TRANSVAGINAL ULTRASOUND OF PELVIS DOPPLER ULTRASOUND OF OVARIES TECHNIQUE: Both transabdominal and transvaginal ultrasound examinations of the pelvis were performed. Transabdominal technique was performed for global imaging of the pelvis including uterus, ovaries, adnexal regions, and pelvic cul-de-sac. It was necessary to proceed with endovaginal exam following the transabdominal exam to visualize the endometrium and RIGHT ovary. Color and duplex Doppler ultrasound was utilized to evaluate blood flow to the ovaries. COMPARISON:  10/26/2004 Correlation: CT abdomen and pelvis 06/08/2021 FINDINGS: Uterus Measurements: 7.8 x 4.9 x 5.2 cm = volume: 104 mL. Anteverted. Heterogeneous myometrium. Ill-defined endometrial complex borders. Few scattered areas of shadowing. Findings raise question of adenomyosis. No discrete uterine mass. Anterior wall Caesarean section scar. Endometrium Thickness: 3 mm. Tiny cystic focus at upper uterine  segment endometrial complex. No additional mass or fluid. Right ovary Measurements: 4.0 x 2.3 x 1.9 cm = volume: 9.3 mL. Normal morphology without mass. Internal blood flow present on color Doppler imaging. Left ovary Measurements: 3.9 x 1.5 x 3.0 cm = volume: 9.1 mL. Normal morphology without mass. Internal blood flow present on color Doppler imaging. Pulsed Doppler evaluation of both ovaries demonstrates normal low-resistance arterial and venous waveforms. Other findings No free pelvic fluid.  No adnexal masses. IMPRESSION: Suspected adenomyosis of the uterus. Remainder of exam unremarkable. No evidence  of ovarian mass or torsion. Electronically Signed   By: Ulyses Southward M.D.   On: 06/08/2021 12:32   US Pelvis Complete  Result Date: 06/08/2021 CLINICAL DATA:  Pelvic pain and bleeding; LMP 06/07/2021 EXAM: TRANSABDOMINAL AND TRANSVAGINAL ULTRASOUND OF PELVIS DOPPLER ULTRASOUND OF OVARIES TECHNIQUE: Both transabdominal and transvaginal ultrasound examinations of the pelvis were performed. Transabdominal technique was performed for global imaging of the pelvis including uterus, ovaries, adnexal regions, and pelvic cul-de-sac. It was necessary to proceed with endovaginal exam following the transabdominal exam to visualize the endometrium and RIGHT ovary. Color and duplex Doppler ultrasound was utilized to evaluate blood flow to the ovaries. COMPARISON:  10/26/2004 Correlation: CT abdomen and pelvis 06/08/2021 FINDINGS: Uterus Measurements: 7.8 x 4.9 x 5.2 cm = volume: 104 mL. Anteverted. Heterogeneous myometrium. Ill-defined endometrial complex borders. Few scattered areas of shadowing. Findings raise question of adenomyosis. No discrete uterine mass. Anterior wall Caesarean section scar. Endometrium Thickness: 3 mm. Tiny cystic focus at upper uterine segment endometrial complex. No additional mass or fluid. Right ovary Measurements: 4.0 x 2.3 x 1.9 cm = volume: 9.3 mL. Normal morphology without mass. Internal  blood flow present on color Doppler imaging. Left ovary Measurements: 3.9 x 1.5 x 3.0 cm = volume: 9.1 mL. Normal morphology without mass. Internal blood flow present on color Doppler imaging. Pulsed Doppler evaluation of both ovaries demonstrates normal low-resistance arterial and venous waveforms. Other findings No free pelvic fluid.  No adnexal masses. IMPRESSION: Suspected adenomyosis of the uterus. Remainder of exam unremarkable. No evidence of ovarian mass or torsion. Electronically Signed   By: Ulyses Southward M.D.   On: 06/08/2021 12:32   CT ABDOMEN PELVIS W CONTRAST  Result Date: 06/08/2021 CLINICAL DATA:  Right lower quadrant pain, vaginal bleeding EXAM: CT ABDOMEN AND PELVIS WITH CONTRAST TECHNIQUE: Multidetector CT imaging of the abdomen and pelvis was performed using the standard protocol following bolus administration of intravenous contrast. CONTRAST:  70mL OMNIPAQUE IOHEXOL 350 MG/ML SOLN COMPARISON:  None. FINDINGS: Lower chest: The lung bases are clear. The imaged heart is unremarkable. Hepatobiliary: There is a small hypodense lesion in the inferior right hepatic lobe likely reflecting a cyst. The liver is otherwise unremarkable. The gallbladder is unremarkable. There is no biliary ductal dilatation. Pancreas: Unremarkable. Spleen: Unremarkable. Adrenals/Urinary Tract: The adrenals are unremarkable. The kidneys are unremarkable, with no focal lesion, stone, hydronephrosis, or hydroureter. The bladder is unremarkable. Stomach/Bowel: The stomach is unremarkable. There is no evidence of bowel obstruction. There is no abnormal bowel wall thickening or inflammatory change. The appendix is normal (5-71). Vascular/Lymphatic: The abdominal aorta is normal in course and caliber. The major branch vessels are patent. The main portal and splenic veins are patent. Reproductive: The uterus is markedly heterogeneous in enhancement at the fundus. The endometrium is also heterogeneous in appearance. No adnexal  mass is seen. Other: There is no ascites or free air. Musculoskeletal: Postsurgical changes are noted in the right iliac wing. There is no acute osseous abnormality or aggressive osseous lesion. IMPRESSION: 1. Heterogeneously enhancing uterus and endometrium. Given the history of vaginal bleeding, pelvic ultrasound is recommended for better evaluation. 2. Otherwise, no acute findings in the abdomen or pelvis. Normal appendix. Electronically Signed   By: Lesia Hausen M.D.   On: 06/08/2021 09:43   [place] Art/Ven Flow Abd Pelv Doppler  Result Date: 06/08/2021 CLINICAL DATA:  Pelvic pain and bleeding; LMP 06/07/2021 EXAM: TRANSABDOMINAL AND TRANSVAGINAL ULTRASOUND OF PELVIS DOPPLER ULTRASOUND OF OVARIES TECHNIQUE: Both transabdominal and transvaginal ultrasound examinations  of the pelvis were performed. Transabdominal technique was performed for global imaging of the pelvis including uterus, ovaries, adnexal regions, and pelvic cul-de-sac. It was necessary to proceed with endovaginal exam following the transabdominal exam to visualize the endometrium and RIGHT ovary. Color and duplex Doppler ultrasound was utilized to evaluate blood flow to the ovaries. COMPARISON:  10/26/2004 Correlation: CT abdomen and pelvis 06/08/2021 FINDINGS: Uterus Measurements: 7.8 x 4.9 x 5.2 cm = volume: 104 mL. Anteverted. Heterogeneous myometrium. Ill-defined endometrial complex borders. Few scattered areas of shadowing. Findings raise question of adenomyosis. No discrete uterine mass. Anterior wall Caesarean section scar. Endometrium Thickness: 3 mm. Tiny cystic focus at upper uterine segment endometrial complex. No additional mass or fluid. Right ovary Measurements: 4.0 x 2.3 x 1.9 cm = volume: 9.3 mL. Normal morphology without mass. Internal blood flow present on color Doppler imaging. Left ovary Measurements: 3.9 x 1.5 x 3.0 cm = volume: 9.1 mL. Normal morphology without mass. Internal blood flow present on color Doppler imaging. Pulsed  Doppler evaluation of both ovaries demonstrates normal low-resistance arterial and venous waveforms. Other findings No free pelvic fluid.  No adnexal masses. IMPRESSION: Suspected adenomyosis of the uterus. Remainder of exam unremarkable. No evidence of ovarian mass or torsion. Electronically Signed   By: Ulyses Southward M.D.   On: 06/08/2021 12:32    Procedures Procedures   Medications Ordered in ED Medications  sodium chloride 0.9 % bolus 1,000 mL (1,000 mLs Intravenous New Bag/Given 06/08/21 0753)    Followed by  0.9 %  sodium chloride infusion (1,000 mLs Intravenous New Bag/Given 06/08/21 0859)  morphine 4 MG/ML injection 4 mg (4 mg Intravenous Given 06/08/21 0754)  ondansetron (ZOFRAN) injection 4 mg (4 mg Intravenous Given 06/08/21 0754)  iohexol (OMNIPAQUE) 350 MG/ML injection 80 mL (80 mLs Intravenous Contrast Given 06/08/21 0850)  ondansetron (ZOFRAN) injection 4 mg (4 mg Intravenous Given 06/08/21 1127)  ketorolac (TORADOL) 30 MG/ML injection 30 mg (30 mg Intravenous Given 06/08/21 1128)    ED Course  I have reviewed the triage vital signs and the nursing notes.  Pertinent labs & imaging results that were available during my care of the patient were reviewed by me and considered in my medical decision making (see chart for details).  Clinical Course as of 06/08/21 1255  Fri Jun 08, 2021  6295 Labs reviewed.  CBC normal.  Metabolic normal.  Pregnancy test negative. [JK]  [id number] CT scan without signs of appendicitis.  Abnormality noted in the uterus.  Ultrasound recommended [JK]    Clinical Course User Index [JK] Linwood Dibbles, MD   MDM Rules/Calculators/A&P                           Patient presented with covid points of abdominal pain.  Recently started to have vaginal bleeding.  Patient was just seen a couple days ago and had a pelvic exam.  With her lower abdominal pain and severe discomfort I was concerned about the possibility of appendicitis.  CT scan was performed and no acute  findings were noted other than in the uterus.  Ultrasound was performed and patient does not have any evidence of torsion or mass.  There is adenomyosis of the uterus.  Suspect this is the etiology of her abdominal pain.  Will discharge home with a course of NSAIDs and antiemetics.  Patient symptoms improved with treatment in the ER.  Recommend outpatient follow-up with OB/GYN. Final Clinical Impression(s) / ED Diagnoses Final  diagnoses:  Adenomyosis of uterus    Rx / DC Orders ED Discharge Orders          Ordered    ibuprofen (ADVIL) 600 MG tablet  Every 8 hours PRN        06/08/21 1254    ondansetron (ZOFRAN ODT) 8 MG disintegrating tablet  Every 8 hours PRN        06/08/21 1254             Linwood Dibbles, MD 06/08/21 1257

## 2021-06-08 NOTE — Discharge Instructions (Signed)
Take the medications as prescribed.  Follow-up with the OB/GYN doctor for further evaluation

## 2021-06-08 NOTE — ED Notes (Signed)
Pt requesting medicine for nausea, MD aware

## 2021-06-08 NOTE — ED Triage Notes (Signed)
Pt BIB EMS, Pt complains of vaginal bleeding, abdominal pain, nausea, and vomiting x 1 day. Pt given 4 mg Zofran. Pt complains of right shoulder pain and right leg pain.

## 2021-08-25 ENCOUNTER — Ambulatory Visit (HOSPITAL_COMMUNITY)
Admission: EM | Admit: 2021-08-25 | Discharge: 2021-08-25 | Disposition: A | Discharge status: Home / Self Care | Payer: Medicare Other | Attending: Emergency Medicine | Admitting: Emergency Medicine

## 2021-08-25 ENCOUNTER — Encounter (HOSPITAL_COMMUNITY): Payer: Self-pay

## 2021-08-25 DIAGNOSIS — B9689 Other specified bacterial agents as the cause of diseases classified elsewhere: Secondary | ICD-10-CM | POA: Insufficient documentation

## 2021-08-25 DIAGNOSIS — N760 Acute vaginitis: Secondary | ICD-10-CM | POA: Insufficient documentation

## 2021-08-25 LAB — POCT URINALYSIS DIPSTICK, ED / UC
Bilirubin Urine: NEGATIVE
Glucose, UA: NEGATIVE mg/dL
Ketones, ur: NEGATIVE mg/dL
Leukocytes,Ua: NEGATIVE
Nitrite: NEGATIVE
Protein, ur: NEGATIVE mg/dL
Specific Gravity, Urine: >=1.03 (ref 1.005–1.030)
Urobilinogen, UA: 1 mg/dL (ref 0.0–1.0)
pH: 6.5 (ref 5.0–8.0)

## 2021-08-25 MED ORDER — METRONIDAZOLE 500 MG PO TABS: 500.0000 mg | ORAL_TABLET | Freq: Two times a day (BID) | ORAL | 0 refills | Status: DC

## 2021-08-25 NOTE — ED Triage Notes (Addendum)
Pt c/o vaginal itch, odor, and burning during urination x2days. Pt states that she was using a new Vaginal PH soap before symptoms started. Pt has had unprotected sex recently. Pt states that it feels like her last BV infection. Pt asks for STD testing and to be screened for BV and a UTI.

## 2021-08-25 NOTE — Discharge Instructions (Signed)
Take Flagyl twice daily for seven days.  

## 2021-08-25 NOTE — ED Provider Notes (Signed)
[facility]  ____________________________________________  Time seen: Approximately 5:09 PM  I have reviewed the triage vital signs and the nursing notes.   HISTORY  Chief Complaint Vaginal Itching   Historian Patient    HPI Carol Potter is a 35 y.o. female presents to the urgent care with concern for bacterial vaginosis.  Patient states that she has had clear white discharge and fishy vaginal odor.  She states that she is particularly prone to BV.  She has little concerns for STDs.  No deep dyspareunia.  She does have occasional dysuria.  No low back pain, fever or vomiting at home.   Past Medical History:  Diagnosis Date   Arthritis    Brachydactyly    type C   Bronchitis      Immunizations up to date:  Yes.     Past Medical History:  Diagnosis Date   Arthritis    Brachydactyly    type C   Bronchitis     There are no problems to display for this patient.   Past Surgical History:  Procedure Laterality Date   arm surgery     foot surgery     HIP SURGERY      Prior to Admission medications   Medication Sig Start Date End Date Taking? Authorizing Provider  EPINEPHrine 0.3 mg/0.3 mL IJ SOAJ injection Inject 0.3 mg into the muscle as needed for anaphylaxis. 03/25/21  Yes [place], Eric J, DO  metroNIDAZOLE (FLAGYL) 500 MG tablet Take 1 tablet (500 mg total) by mouth 2 (two) times daily. 08/25/21  Yes Pia Mau M, PA-C  ondansetron (ZOFRAN ODT) 8 MG disintegrating tablet Take 1 tablet (8 mg total) by mouth every 8 (eight) hours as needed for nausea or vomiting. 06/08/21  Yes Linwood Dibbles, MD    Allergies Penicillins, Bee venom, and Other  History reviewed. No pertinent family history.  Social History Social History   Tobacco Use   Smoking status: Some Days    Types: Cigarettes   Smokeless tobacco: Never  Substance Use Topics   Alcohol use: Yes   Drug use: Yes    Types: Marijuana     Review of Systems  Constitutional: No  fever/chills Eyes:  No discharge ENT: No upper respiratory complaints. Respiratory: no cough. No SOB/ use of accessory muscles to breath Gastrointestinal:   No nausea, no vomiting.  No diarrhea.  No constipation. Genitourinary: Patient has dysuria.  Musculoskeletal: Negative for musculoskeletal pain. Skin: Negative for rash, abrasions, lacerations, ecchymosis.    ____________________________________________   PHYSICAL EXAM:  VITAL SIGNS: ED Triage Vitals  Enc Vitals Group     BP 08/25/21 1627 102/66     Pulse Rate 08/25/21 1627 95     Resp 08/25/21 1627 18     Temp 08/25/21 1627 98.1 F (36.7 C)     Temp Source 08/25/21 1627 Oral     SpO2 08/25/21 1627 97 %     Weight 08/25/21 1625 127 lb (57.6 kg)     Height 08/25/21 1625 5' (1.524 m)     Head Circumference --      Peak Flow --      Pain Score 08/25/21 1624 10     Pain Loc --      Pain Edu? --      Excl. in [place]? --      Constitutional: Alert and oriented. Well appearing and in no acute distress. Eyes: Conjunctivae are normal. PERRL. EOMI. Head: Atraumatic. ENT:  Nose: No congestion/rhinnorhea.      Mouth/Throat: Mucous membranes are moist.  Neck: No stridor.  No cervical spine tenderness to palpation. Cardiovascular: Normal rate, regular rhythm. Normal S1 and S2.  Good peripheral circulation. Respiratory: Normal respiratory effort without tachypnea or retractions. Lungs CTAB. Good air entry to the bases with no decreased or absent breath sounds Gastrointestinal: Bowel sounds x 4 quadrants. Soft and nontender to palpation. No guarding or rigidity. No distention. Musculoskeletal: Full range of motion to all extremities. No obvious deformities noted Neurologic:  Normal for age. No gross focal neurologic deficits are appreciated.  Skin:  Skin is warm, dry and intact. No rash noted. Psychiatric: Mood and affect are normal for age. Speech and behavior are normal.   ____________________________________________    LABS (all labs ordered are listed, but only abnormal results are displayed)  Labs Reviewed  POCT URINALYSIS DIPSTICK, ED / UC - Abnormal; Notable for the following components:      Result Value   Hgb urine dipstick TRACE (*)    All other components within normal limits  CERVICOVAGINAL ANCILLARY ONLY   ____________________________________________  EKG   ____________________________________________  RADIOLOGY   No results found.  ____________________________________________    PROCEDURES  Procedure(s) performed:     Procedures     Medications - No data to display   ____________________________________________   INITIAL IMPRESSION / ASSESSMENT AND PLAN / ED COURSE  Pertinent labs & imaging results that were available during my care of the patient were reviewed by me and considered in my medical decision making (see chart for details).      Assessment and plan BV 35 year old female presents to the urgent care with concern for bacterial vaginosis.  Cervical vaginal swab in process at this time.  No signs of UTI on urinalysis.  Patient was treated empirically with Flagyl given history.  Return precautions were given to return with new or worsening symptoms.     ____________________________________________  FINAL CLINICAL IMPRESSION(S) / ED DIAGNOSES  Final diagnoses:  Bacterial vaginosis      NEW MEDICATIONS STARTED DURING THIS VISIT:  ED Discharge Orders          Ordered    metroNIDAZOLE (FLAGYL) 500 MG tablet  2 times daily        08/25/21 1649                This chart was dictated using voice recognition software/Dragon. Despite best efforts to proofread, errors can occur which can change the meaning. Any change was purely unintentional.     Orvil Feil, PA-C 08/25/21 1714

## 2021-08-27 LAB — CERVICOVAGINAL ANCILLARY ONLY
Bacterial Vaginitis (gardnerella): POSITIVE — AB
Candida Glabrata: NEGATIVE
Candida Vaginitis: NEGATIVE
Chlamydia: NEGATIVE
Comment: NEGATIVE
Comment: NEGATIVE
Comment: NEGATIVE
Comment: NEGATIVE
Comment: NEGATIVE
Comment: NORMAL
Neisseria Gonorrhea: NEGATIVE
Trichomonas: NEGATIVE

## 2021-09-10 ENCOUNTER — Telehealth: Payer: Medicare Other | Admitting: Nurse Practitioner

## 2021-09-10 DIAGNOSIS — B379 Candidiasis, unspecified: Secondary | ICD-10-CM

## 2021-09-10 DIAGNOSIS — N760 Acute vaginitis: Secondary | ICD-10-CM | POA: Diagnosis not present

## 2021-09-10 DIAGNOSIS — B9689 Other specified bacterial agents as the cause of diseases classified elsewhere: Secondary | ICD-10-CM | POA: Diagnosis not present

## 2021-09-10 MED ORDER — FLUCONAZOLE 150 MG PO TABS: 150.0000 mg | ORAL_TABLET | Freq: Once | ORAL | 0 refills | Status: AC

## 2021-09-10 MED ORDER — METRONIDAZOLE 0.75 % VA GEL: 1.0000 | Freq: Every day | VAGINAL | 0 refills | Status: AC

## 2021-09-10 NOTE — Progress Notes (Signed)
Virtual Visit Consent   Carol Potter, you are scheduled for a virtual visit with a [facility] provider today.     Just as with appointments in the office, your consent must be obtained to participate.  Your consent will be active for this visit and any virtual visit you may have with one of our providers in the next 365 days.     If you have a MyChart account, a copy of this consent can be sent to you electronically.  All virtual visits are billed to your insurance company just like a traditional visit in the office.    As this is a virtual visit, video technology does not allow for your provider to perform a traditional examination.  This may limit your provider's ability to fully assess your condition.  If your provider identifies any concerns that need to be evaluated in person or the need to arrange testing (such as labs, EKG, etc.), we will make arrangements to do so.     Although advances in technology are sophisticated, we cannot ensure that it will always work on either your end or our end.  If the connection with a video visit is poor, the visit may have to be switched to a telephone visit.  With either a video or telephone visit, we are not always able to ensure that we have a secure connection.     I need to obtain your verbal consent now.   Are you willing to proceed with your visit today?    Carol Potter has provided verbal consent on 09/10/2021 for a virtual visit (video or telephone).   Viviano Simas, FNP   Date: 09/10/2021 5:25 PM   Virtual Visit via Video Note   I, Viviano Simas, connected with  Carol Potter  ([id number], December 16, 1985) on 09/10/21 at  5:30 PM EST by a video-enabled telemedicine application and verified that I am speaking with the correct person using two identifiers.  Location: Patient: Virtual Visit Location Patient: Home Provider: Virtual Visit Location Provider: Home Office   I discussed the limitations of evaluation and management by  telemedicine and the availability of in person appointments. The patient expressed understanding and agreed to proceed.    History of Present Illness: Carol Potter is a 35 y.o. who identifies as a female who was assigned female at birth, and is being seen today for recurrent vaginal symptoms.  She was seen at UC last week and tested positive for BV at that time. She had STI testing at that time that was negative.   Her symptoms today are itching and burning and generalized discomfort.  Denies visual discharge.   She still has a fish odor as well.    Problems: There are no problems to display for this patient.   Allergies:  Allergies  Allergen Reactions   Penicillins Itching    Vaginal Yeast infection   Bee Venom    Other Other (See Comments)    "Vaginal Yeast Infection after taking any antibiotic"   Medications:  Current Outpatient Medications:    EPINEPHrine 0.3 mg/0.3 mL IJ SOAJ injection, Inject 0.3 mg into the muscle as needed for anaphylaxis., Disp: 1 each, Rfl: 0   metroNIDAZOLE (FLAGYL) 500 MG tablet, Take 1 tablet (500 mg total) by mouth 2 (two) times daily., Disp: 14 tablet, Rfl: 0   ondansetron (ZOFRAN ODT) 8 MG disintegrating tablet, Take 1 tablet (8 mg total) by mouth every 8 (eight) hours as needed for nausea or vomiting.,  Disp: 12 tablet, Rfl: 0  Observations/Objective: Patient is well-developed, well-nourished in no acute distress.  Resting comfortably at home.  Head is normocephalic, atraumatic.  No labored breathing.  Speech is clear and coherent with logical content.  Patient is alert and oriented at baseline.    Assessment and Plan: 1. Yeast infection- post Flagyl treatment   - fluconazole (DIFLUCAN) 150 MG tablet; Take 1 tablet (150 mg total) by mouth once for 1 dose.  Dispense: 1 tablet; Refill: 0  If fishy odor persists she will apply vaginal gel as discussed.  2. Bacterial vaginitis  - metroNIDAZOLE (METROGEL VAGINAL) 0.75 % vaginal gel; Place  1 Applicatorful vaginally at bedtime for 5 days.  Dispense: 50 g; Refill: 0    Discussed decreasing washing area and shaving. Avoid internal washing In person follow up is recommended if symptoms persist   Follow Up Instructions: I discussed the assessment and treatment plan with the patient. The patient was provided an opportunity to ask questions and all were answered. The patient agreed with the plan and demonstrated an understanding of the instructions.  A copy of instructions were sent to the patient via MyChart unless otherwise noted below.     The patient was advised to call back or seek an in-person evaluation if the symptoms worsen or if the condition fails to improve as anticipated.  Time:  I spent 10 minutes with the patient via telehealth technology discussing the above problems/concerns.    Viviano Simas, FNP

## 2021-09-28 ENCOUNTER — Encounter (HOSPITAL_COMMUNITY): Payer: Self-pay | Admitting: *Deleted

## 2021-09-28 ENCOUNTER — Other Ambulatory Visit: Payer: Self-pay

## 2021-09-28 ENCOUNTER — Emergency Department (HOSPITAL_COMMUNITY)
Admission: EM | Admit: 2021-09-28 | Discharge: 2021-09-28 | Disposition: A | Discharge status: Home / Self Care | Payer: Medicare Other | Attending: Emergency Medicine | Admitting: Emergency Medicine

## 2021-09-28 DIAGNOSIS — R079 Chest pain, unspecified: Secondary | ICD-10-CM | POA: Diagnosis present

## 2021-09-28 DIAGNOSIS — Z5321 Procedure and treatment not carried out due to patient leaving prior to being seen by health care provider: Secondary | ICD-10-CM | POA: Insufficient documentation

## 2021-09-28 LAB — BASIC METABOLIC PANEL
Anion gap: 8 (ref 5–15)
BUN: 8 mg/dL (ref 6–20)
CO2: 22 mmol/L (ref 22–32)
Calcium: 9 mg/dL (ref 8.9–10.3)
Chloride: 108 mmol/L (ref 98–111)
Creatinine, Ser: 0.68 mg/dL (ref 0.44–1.00)
GFR, Estimated: >60 mL/min (ref >60)
Glucose, Bld: 117 mg/dL — ABNORMAL HIGH (ref 70–99)
Potassium: 3.8 mmol/L (ref 3.5–5.1)
Sodium: 138 mmol/L (ref 135–145)

## 2021-09-28 LAB — CBC
HCT: 40.1 % (ref 36.0–46.0)
Hemoglobin: 12.9 g/dL (ref 12.0–15.0)
MCH: 27 pg (ref 26.0–34.0)
MCHC: 32.2 g/dL (ref 30.0–36.0)
MCV: 83.9 fL (ref 80.0–100.0)
Platelets: 241 10*3/uL (ref 150–400)
RBC: 4.78 MIL/uL (ref 3.87–5.11)
RDW: 13.4 % (ref 11.5–15.5)
WBC: 5.2 10*3/uL (ref 4.0–10.5)
nRBC: 0 % (ref 0.0–0.2)

## 2021-09-28 LAB — I-STAT BETA HCG BLOOD, ED (MC, WL, AP ONLY): I-stat hCG, quantitative: <5 m[IU]/mL (ref <5)

## 2021-09-28 LAB — TROPONIN I (HIGH SENSITIVITY): Troponin I (High Sensitivity): 3 ng/L (ref <18)

## 2021-09-28 LAB — CBG MONITORING, ED: Glucose-Capillary: 126 mg/dL — ABNORMAL HIGH (ref 70–99)

## 2021-09-28 NOTE — ED Triage Notes (Signed)
The pt is c/o chest pain since this am  no cough or cold no previous history   lmp 2 days ago

## 2021-09-28 NOTE — ED Provider Triage Note (Signed)
Emergency Medicine Provider Triage Evaluation Note  [place] T Ciszek , a 36 y.o. female  was evaluated in triage.  Pt complains of chest pain that she states is achy and occasionally sharp.  She states that it hurts worse when she moves her left arm around she says it is left-sided.  She states that she feels somewhat short of breath has been coughing daily very hard in the morning she states that she has had a small amount of scant BRB hemoptysis.  She is not a cancer patient denies any unilateral or bilateral leg swelling.  Review of Systems  Positive: Chest pain, cough Negative: Fever  Physical Exam  BP 104/75 (BP Location: Right Arm)    Pulse 66    Temp 98.4 F (36.9 C) (Oral)    Resp 16    Ht 5' (1.524 m)    Wt 57.6 kg    LMP 09/25/2021    SpO2 98%    BMI 24.80 kg/m  Gen:   Awake, no distress   Resp:  Normal effort  MSK:   Moves extremities without difficulty  Other:  Reproducible anterior chest wall tenderness palpation.  Medical Decision Making  Medically screening exam initiated at 4:30 PM.  Appropriate orders placed.  [place] T Meyering was informed that the remainder of the evaluation will be completed by another provider, this initial triage assessment does not replace that evaluation, and the importance of remaining in the ED until their evaluation is complete.  Chest pain work-up initiated   Gailen Shelter, [place] 09/28/21 1631

## 2021-09-28 NOTE — ED Notes (Signed)
Called 3x for vitals, no answer 

## 2021-10-01 ENCOUNTER — Other Ambulatory Visit: Payer: Self-pay

## 2021-10-01 ENCOUNTER — Encounter (HOSPITAL_COMMUNITY): Payer: Self-pay

## 2021-10-01 ENCOUNTER — Ambulatory Visit (HOSPITAL_COMMUNITY)
Admission: EM | Admit: 2021-10-01 | Discharge: 2021-10-01 | Disposition: A | Discharge status: Home / Self Care | Payer: Medicare Other | Attending: Physician Assistant | Admitting: Physician Assistant

## 2021-10-01 DIAGNOSIS — Z113 Encounter for screening for infections with a predominantly sexual mode of transmission: Secondary | ICD-10-CM | POA: Diagnosis not present

## 2021-10-01 DIAGNOSIS — R051 Acute cough: Secondary | ICD-10-CM | POA: Insufficient documentation

## 2021-10-01 DIAGNOSIS — J069 Acute upper respiratory infection, unspecified: Secondary | ICD-10-CM | POA: Diagnosis not present

## 2021-10-01 DIAGNOSIS — J029 Acute pharyngitis, unspecified: Secondary | ICD-10-CM | POA: Diagnosis present

## 2021-10-01 DIAGNOSIS — Z20822 Contact with and (suspected) exposure to covid-19: Secondary | ICD-10-CM | POA: Diagnosis not present

## 2021-10-01 LAB — POC INFLUENZA A AND B ANTIGEN (URGENT CARE ONLY)
INFLUENZA A ANTIGEN, POC: NEGATIVE
INFLUENZA B ANTIGEN, POC: NEGATIVE

## 2021-10-01 MED ORDER — PROMETHAZINE-DM 6.25-15 MG/5ML PO SYRP: 5.0000 mL | ORAL_SOLUTION | Freq: Four times a day (QID) | ORAL | 0 refills | Status: DC | PRN

## 2021-10-01 NOTE — Discharge Instructions (Signed)
Recommend Flonase, Mucinex Take cough syrup as needed for cough Rest, drink plenty of fluids  Lab results pending, will call with results

## 2021-10-01 NOTE — ED Provider Notes (Signed)
[facility]    CSN: [id number] Arrival date & time: 10/01/21  1846      History   Chief Complaint Chief Complaint  Patient presents with   STD Testing   URI    HPI Carol Potter is a 36 y.o. female.   Pt complains of sore throat, body aches, congestion, and headaches that started three days ago.  She also complains of a dry cough.  Reports chest wall tenderness.  Presented to ED on 1/13 with similar sx, EKG with no acute findings.  Pt left before visit completed.  She is taking Mucinex with minimal improvement.  She also requests STD testing today.  Reports treated for BV about one month ago, sx improved.  She would like to be tested again. Reports increased vaginal discharge that started a few days ago.  Denies dysuria, abdominal pain, pelvic pain.    Past Medical History:  Diagnosis Date   Arthritis    Brachydactyly    type C   Bronchitis     There are no problems to display for this patient.   Past Surgical History:  Procedure Laterality Date   arm surgery     foot surgery     HIP SURGERY      OB History   No obstetric history on file.      Home Medications    Prior to Admission medications   Medication Sig Start Date End Date Taking? Authorizing Provider  promethazine-dextromethorphan (PROMETHAZINE-DM) 6.25-15 MG/5ML syrup Take 5 mLs by mouth 4 (four) times daily as needed for cough. 10/01/21  Yes Ward, Lenise Arena, PA-C  EPINEPHrine 0.3 mg/0.3 mL IJ SOAJ injection Inject 0.3 mg into the muscle as needed for anaphylaxis. 03/25/21   [place] Eric J, DO  ondansetron (ZOFRAN ODT) 8 MG disintegrating tablet Take 1 tablet (8 mg total) by mouth every 8 (eight) hours as needed for nausea or vomiting. 06/08/21   Dorie Rank, MD    Family History Family History  Family history unknown: Yes    Social History Social History   Tobacco Use   Smoking status: Some Days    Types: Cigarettes   Smokeless tobacco: Never  Substance Use Topics   Alcohol  use: Yes   Drug use: Yes    Types: Marijuana     Allergies   Penicillins, Bee venom, and Other   Review of Systems Review of Systems  Constitutional:  Negative for chills and fever.  HENT:  Positive for congestion. Negative for ear pain and sore throat.   Eyes:  Negative for pain and visual disturbance.  Respiratory:  Positive for cough. Negative for shortness of breath.   Cardiovascular:  Negative for chest pain and palpitations.  Gastrointestinal:  Negative for abdominal pain and vomiting.  Genitourinary:  Positive for vaginal discharge. Negative for dysuria and hematuria.  Musculoskeletal:  Positive for myalgias. Negative for arthralgias and back pain.  Skin:  Negative for color change and rash.  Neurological:  Positive for headaches. Negative for seizures and syncope.  All other systems reviewed and are negative.   Physical Exam Triage Vital Signs ED Triage Vitals  Enc Vitals Group     BP 10/01/21 1912 98/65     Pulse Rate 10/01/21 1912 68     Resp 10/01/21 1912 17     Temp 10/01/21 1912 98.2 F (36.8 C)     Temp Source 10/01/21 1912 Oral     SpO2 10/01/21 1912 100 %     Weight --  Height --      Head Circumference --      Peak Flow --      Pain Score 10/01/21 1911 7     Pain Loc --      Pain Edu? --      Excl. in [place]? --    No data found.  Updated Vital Signs BP 98/65 (BP Location: Right Arm)    Pulse 68    Temp 98.2 F (36.8 C) (Oral)    Resp 17    LMP 09/25/2021    SpO2 100%   Visual Acuity Right Eye Distance:   Left Eye Distance:   Bilateral Distance:    Right Eye Near:   Left Eye Near:    Bilateral Near:     Physical Exam Vitals and nursing note reviewed.  Constitutional:      General: She is not in acute distress.    Appearance: She is well-developed.  HENT:     Head: Normocephalic and atraumatic.  Eyes:     Conjunctiva/sclera: Conjunctivae normal.  Cardiovascular:     Rate and Rhythm: Normal rate and regular rhythm.     Heart  sounds: No murmur heard. Pulmonary:     Effort: Pulmonary effort is normal. No respiratory distress.     Breath sounds: Normal breath sounds.  Abdominal:     Palpations: Abdomen is soft.     Tenderness: There is no abdominal tenderness.  Musculoskeletal:        General: No swelling.     Cervical back: Neck supple.  Skin:    General: Skin is warm and dry.     Capillary Refill: Capillary refill takes less than 2 seconds.  Neurological:     Mental Status: She is alert.  Psychiatric:        Mood and Affect: Mood normal.     UC Treatments / Results  Labs (all labs ordered are listed, but only abnormal results are displayed) Labs Reviewed  SARS CORONAVIRUS 2 (TAT 6-24 HRS)  POC INFLUENZA A AND B ANTIGEN (URGENT CARE ONLY)  CERVICOVAGINAL ANCILLARY ONLY    EKG   Radiology No results found.  Procedures Procedures (including critical care time)  Medications Ordered in UC Medications - No data to display  Initial Impression / Assessment and Plan / UC Course  I have reviewed the triage vital signs and the nursing notes.  Pertinent labs & imaging results that were available during my care of the patient were reviewed by me and considered in my medical decision making (see chart for details).     URI, flu negative.  Cough syrup prescribed.  Supportive care discussed.  Cervicovaginal swab results pending.  Will treat based on results. Return precautions discussed.  Final Clinical Impressions(s) / UC Diagnoses   Final diagnoses:  Viral upper respiratory tract infection  Screen for STD (sexually transmitted disease)     Discharge Instructions      Recommend Flonase, Mucinex Take cough syrup as needed for cough Rest, drink plenty of fluids  Lab results pending, will call with results      ED Prescriptions     Medication Sig Dispense Auth. Provider   promethazine-dextromethorphan (PROMETHAZINE-DM) 6.25-15 MG/5ML syrup Take 5 mLs by mouth 4 (four) times daily as  needed for cough. 118 mL Ward, Lenise Arena, PA-C      PDMP not reviewed this encounter.   Ward, Lenise Arena, PA-C 10/01/21 1933

## 2021-10-01 NOTE — ED Triage Notes (Signed)
Pt presents with sore throat, generalized body aches, headache, congestion, cough, and chest pain X 3 days; pt was seen in ED on Friday for symptoms.   Pt also request STD testing after recently being treated for BV.

## 2021-10-02 LAB — CERVICOVAGINAL ANCILLARY ONLY
Bacterial Vaginitis (gardnerella): NEGATIVE
Candida Glabrata: NEGATIVE
Candida Vaginitis: NEGATIVE
Chlamydia: NEGATIVE
Comment: NEGATIVE
Comment: NEGATIVE
Comment: NEGATIVE
Comment: NEGATIVE
Comment: NEGATIVE
Comment: NORMAL
Neisseria Gonorrhea: NEGATIVE
Trichomonas: NEGATIVE

## 2021-10-02 LAB — SARS CORONAVIRUS 2 (TAT 6-24 HRS): SARS Coronavirus 2: NEGATIVE

## 2021-11-12 ENCOUNTER — Other Ambulatory Visit: Payer: Self-pay

## 2021-11-12 ENCOUNTER — Emergency Department (HOSPITAL_COMMUNITY)
Admission: EM | Admit: 2021-11-12 | Discharge: 2021-11-12 | Disposition: A | Discharge status: Home / Self Care | Payer: Medicare Other | Attending: Emergency Medicine | Admitting: Emergency Medicine

## 2021-11-12 ENCOUNTER — Encounter (HOSPITAL_COMMUNITY): Payer: Self-pay

## 2021-11-12 DIAGNOSIS — N898 Other specified noninflammatory disorders of vagina: Secondary | ICD-10-CM | POA: Diagnosis present

## 2021-11-12 DIAGNOSIS — R3989 Other symptoms and signs involving the genitourinary system: Secondary | ICD-10-CM | POA: Diagnosis not present

## 2021-11-12 DIAGNOSIS — B3731 Acute candidiasis of vulva and vagina: Secondary | ICD-10-CM | POA: Diagnosis not present

## 2021-11-12 LAB — URINALYSIS, ROUTINE W REFLEX MICROSCOPIC
Bilirubin Urine: NEGATIVE
Glucose, UA: NEGATIVE mg/dL
Ketones, ur: NEGATIVE mg/dL
Nitrite: NEGATIVE
Protein, ur: NEGATIVE mg/dL
Specific Gravity, Urine: 1.018 (ref 1.005–1.030)
WBC, UA: >50 WBC/hpf — ABNORMAL HIGH (ref 0–5)
pH: 5 (ref 5.0–8.0)

## 2021-11-12 LAB — WET PREP, GENITAL
Clue Cells Wet Prep HPF POC: NONE SEEN
Sperm: NONE SEEN
Trich, Wet Prep: NONE SEEN
WBC, Wet Prep HPF POC: <10 (ref <10)

## 2021-11-12 LAB — PREGNANCY, URINE: Preg Test, Ur: NEGATIVE

## 2021-11-12 MED ORDER — FLUCONAZOLE 200 MG PO TABS
200.0000 mg | ORAL_TABLET | Freq: Once | ORAL | Status: AC
  Administered 2021-11-12: 200 mg via ORAL
  Filled 2021-11-12: qty 1

## 2021-11-12 MED ORDER — FLUCONAZOLE 150 MG PO TABS: 150.0000 mg | ORAL_TABLET | Freq: Once | ORAL | 0 refills | Status: AC

## 2021-11-12 NOTE — ED Provider Notes (Signed)
[facility] DEPT Provider Note   CSN: [id number] Arrival date & time: 11/12/21  0735     History  Chief Complaint  Patient presents with   Vaginal Itching   Urinary Symptoms    Carol Potter is a 36 y.o. female.  The history is provided by the patient.  Vaginal Itching This is a recurrent problem. The problem occurs constantly. The problem has not changed since onset.Pertinent negatives include no chest pain, no abdominal pain, no headaches and no shortness of breath. Nothing aggravates the symptoms. Nothing relieves the symptoms. Treatments tried: OTC vaginal cream. The treatment provided no relief.      Home Medications Prior to Admission medications   Medication Sig Start Date End Date Taking? Authorizing Provider  fluconazole (DIFLUCAN) 150 MG tablet Take 1 tablet (150 mg total) by mouth once for 1 dose. 11/12/21 11/12/21 Yes Katalyn Matin, DO  EPINEPHrine 0.3 mg/0.3 mL IJ SOAJ injection Inject 0.3 mg into the muscle as needed for anaphylaxis. 03/25/21   [place] Eric J, DO  ondansetron (ZOFRAN ODT) 8 MG disintegrating tablet Take 1 tablet (8 mg total) by mouth every 8 (eight) hours as needed for nausea or vomiting. 06/08/21   Dorie Rank, MD  promethazine-dextromethorphan (PROMETHAZINE-DM) 6.25-15 MG/5ML syrup Take 5 mLs by mouth 4 (four) times daily as needed for cough. 10/01/21   Ward, Lenise Arena, PA-C      Allergies    Penicillins, Bee venom, and Other    Review of Systems   Review of Systems  Respiratory:  Negative for shortness of breath.   Cardiovascular:  Negative for chest pain.  Gastrointestinal:  Negative for abdominal pain.  Neurological:  Negative for headaches.   Physical Exam Updated Vital Signs BP 118/76 (BP Location: Left Arm)    Pulse 75    Temp 98.6 F (37 C) (Oral)    Resp 16    Ht 5' (1.524 m)    Wt 59 kg    LMP 10/16/2021 (Approximate)    SpO2 99%    BMI 25.39 kg/m  Physical Exam Constitutional:      General: She  is not in acute distress.    Appearance: She is not ill-appearing.  Cardiovascular:     Pulses: Normal pulses.  Pulmonary:     Effort: Pulmonary effort is normal.  Abdominal:     Tenderness: There is no abdominal tenderness.  Genitourinary:    Comments: Deferred for patient preference Neurological:     Mental Status: She is alert.    ED Results / Procedures / Treatments   Labs (all labs ordered are listed, but only abnormal results are displayed) Labs Reviewed  WET PREP, GENITAL - Abnormal; Notable for the following components:      Result Value   Yeast Wet Prep HPF POC PRESENT (*)    All other components within normal limits  URINALYSIS, ROUTINE W REFLEX MICROSCOPIC - Abnormal; Notable for the following components:   APPearance HAZY (*)    Hgb urine dipstick SMALL (*)    Leukocytes,Ua SMALL (*)    WBC, UA >50 (*)    Bacteria, UA RARE (*)    Non Squamous Epithelial 0-5 (*)    All other components within normal limits  URINE CULTURE  PREGNANCY, URINE  GC/CHLAMYDIA PROBE AMP ([place]) NOT AT [facility]    EKG None  Radiology No results found.  Procedures Procedures    Medications Ordered in ED Medications  fluconazole (DIFLUCAN) tablet 200 mg (has no administration  in time range)    ED Course/ Medical Decision Making/ A&P                           Medical Decision Making Amount and/or Complexity of Data Reviewed Labs: ordered.  Risk Prescription drug management.   Carol T Teresi is here with vaginal discharge.  Denies any abdominal pain, fever, chills.  States that she has a history of bacterial vaginosis and yeast infections.  She is just having mostly irritation and discharge.  She has no concern for STDs at this time.  She has no abdominal pain.  I have no concern for PID.  Differential includes bacterial vaginosis versus UTI versus yeast infection.  Less likely STD but will test for gonorrhea and chlamydia and trichomonas.  Will check urine study.  Will  check pregnancy test.  Patient prefers self swab which I think is reasonable given her symptoms.  Wet prep is positive for yeast.  Will treat with Diflucan.  Urinalysis appears to be with contamination.  We will hold off on any treatment until urine culture results.  We will give her information to follow-up at [facility].  Discharged in good condition.  This chart was dictated using voice recognition software.  Despite best efforts to proofread,  errors can occur which can change the documentation meaning.         Final Clinical Impression(s) / ED Diagnoses Final diagnoses:  Yeast vaginitis    Rx / DC Orders ED Discharge Orders          Ordered    fluconazole (DIFLUCAN) 150 MG tablet   Once        11/12/21 [place], [place], DO 11/12/21 [phone]

## 2021-11-12 NOTE — ED Triage Notes (Signed)
Pt reports vaginal irritation, itching, warmth, thick white discharge, increased frequency of urination, and burning while urinating since Wednesday. Pt reports that urine is cloudy. Pt also reports using a leftover prescription vaginosis cream and OTC yeast infection tx. Pt reports that neither have helped. Hx of UTI, bacterial vaginosis, and yeast infections.

## 2021-11-13 LAB — GC/CHLAMYDIA PROBE AMP (~~LOC~~) NOT AT ARMC
Chlamydia: NEGATIVE
Comment: NEGATIVE
Comment: NORMAL
Neisseria Gonorrhea: NEGATIVE

## 2021-11-14 LAB — URINE CULTURE: Culture: >=100000 — AB

## 2021-11-15 ENCOUNTER — Telehealth: Payer: Self-pay

## 2021-11-15 NOTE — Progress Notes (Signed)
ED Antimicrobial Stewardship Positive Culture Follow Up   Carol Potter is an 36 y.o. female who presented to [facility] on 11/12/2021 with a chief complaint of  Chief Complaint  Patient presents with   Vaginal Itching   Urinary Symptoms    Recent Results (from the past 720 hour(s))  Urine Culture     Status: Abnormal   Collection Time: 11/12/21  8:15 AM   Specimen: Urine, Clean Catch  Result Value Ref Range Status   Specimen Description   Final    URINE, CLEAN CATCH Performed at [facility], [place] [address] [place], [place] [address]    Special Requests   Final    NONE Performed at [facility], [place] [address], [place], [place] [address]    Culture >=100,000 COLONIES/mL PROTEUS MIRABILIS (A)  Final   Report Status 11/14/2021 FINAL  Final   Organism ID, Bacteria PROTEUS MIRABILIS (A)  Final      Susceptibility   Proteus mirabilis - MIC*    AMPICILLIN >=32 RESISTANT Resistant     CEFAZOLIN <=4 SENSITIVE Sensitive     CEFEPIME <=0.12 SENSITIVE Sensitive     CEFTRIAXONE <=0.25 SENSITIVE Sensitive     CIPROFLOXACIN <=0.25 SENSITIVE Sensitive     GENTAMICIN <=1 SENSITIVE Sensitive     IMIPENEM 2 SENSITIVE Sensitive     NITROFURANTOIN RESISTANT Resistant     TRIMETH/SULFA <=20 SENSITIVE Sensitive     AMPICILLIN/SULBACTAM 4 SENSITIVE Sensitive     PIP/TAZO <=4 SENSITIVE Sensitive     * >=100,000 COLONIES/mL PROTEUS MIRABILIS  Wet prep, genital     Status: Abnormal   Collection Time: 11/12/21  8:29 AM   Specimen: PATH Cytology Cervicovaginal Ancillary Only  Result Value Ref Range Status   Yeast Wet Prep HPF POC PRESENT (A) NONE SEEN Final   Trich, Wet Prep NONE SEEN NONE SEEN Final   Clue Cells Wet Prep HPF POC NONE SEEN NONE SEEN Final   WBC, Wet Prep HPF POC <10 <10 Final   Sperm NONE SEEN  Final    Comment: Performed at [facility], [place] [address] [place], [place] [address]    [x]  Patient discharged  originally without antimicrobial agent and treatment is now indicated; Needs additional follow-up  UA was unimpressive, wet prep positive for yeast so received Fluconazole.   Needs additional follow-up - call for symptom check: - If resolved/improved - no further treatment warranted - If still symptomatic - Bactrim DS 1 tab bid x 3 days  ED Provider: Dene Gentry, MD   Lawson Radar 11/15/2021, 9:27 AM Clinical Pharmacist Monday - Friday phone -  [phone] Saturday - Sunday phone - [phone]

## 2021-11-15 NOTE — Telephone Encounter (Signed)
Post ED Visit - Positive Culture Follow-up: Successful Patient Follow-Up ? ?Culture assessed and recommendations reviewed by: ? ?[]  Elenor Quinones, Pharm.D. ?[]  Heide Guile, Pharm.D., BCPS AQ-ID ?[]  Parks Neptune, Pharm.D., BCPS ?[x]  Alycia Rossetti, Pharm.D., BCPS ?[]  [place], Pharm.D., BCPS, AAHIVP ?[]  Legrand Como, Pharm.D., BCPS, AAHIVP ?[]  Salome Arnt, PharmD, BCPS ?[]  Johnnette Gourd, PharmD, BCPS ?[]  Hughes Better, PharmD, BCPS ?[]  Leeroy Cha, PharmD ? ?Positive urine culture ? ?Plan - called pt for symptoms check. Pt states she is still having burning, increased frequency and discharge. New prescription called in as directed.  ? ?[]  Patient discharged without antimicrobial prescription and treatment is now indicated ?[x]  Organism is resistant to prescribed ED discharge antimicrobial ?[]  Patient with positive blood cultures ? ?Changes discussed with ED provider: Dene Gentry, MD ?New antibiotic prescription Bactrim DS 1 tablet BID x 3 days ?Called to [employer] [facility] ? ?Contacted patient, date 11/15/2021, time 11:15 am ? ? ?Carol Potter ?11/15/2021, 11:22 AM ? ?  ?

## 2021-12-03 ENCOUNTER — Emergency Department (HOSPITAL_COMMUNITY)
Admission: EM | Admit: 2021-12-03 | Discharge: 2021-12-03 | Disposition: A | Discharge status: Home / Self Care | Payer: Medicare Other | Attending: Emergency Medicine | Admitting: Emergency Medicine

## 2021-12-03 ENCOUNTER — Telehealth: Payer: Medicare Other | Admitting: Emergency Medicine

## 2021-12-03 ENCOUNTER — Encounter (HOSPITAL_COMMUNITY): Payer: Self-pay

## 2021-12-03 DIAGNOSIS — A059 Bacterial foodborne intoxication, unspecified: Secondary | ICD-10-CM | POA: Insufficient documentation

## 2021-12-03 DIAGNOSIS — N760 Acute vaginitis: Secondary | ICD-10-CM

## 2021-12-03 DIAGNOSIS — B9689 Other specified bacterial agents as the cause of diseases classified elsewhere: Secondary | ICD-10-CM

## 2021-12-03 DIAGNOSIS — B3731 Acute candidiasis of vulva and vagina: Secondary | ICD-10-CM

## 2021-12-03 DIAGNOSIS — A6009 Herpesviral infection of other urogenital tract: Secondary | ICD-10-CM

## 2021-12-03 DIAGNOSIS — R109 Unspecified abdominal pain: Secondary | ICD-10-CM | POA: Diagnosis present

## 2021-12-03 MED ORDER — PROMETHAZINE HCL 25 MG PO TABS: 25.0000 mg | ORAL_TABLET | Freq: Four times a day (QID) | ORAL | 0 refills | Status: DC | PRN

## 2021-12-03 MED ORDER — PROMETHAZINE HCL 25 MG PO TABS
25.0000 mg | ORAL_TABLET | Freq: Once | ORAL | Status: AC
  Administered 2021-12-03: 25 mg via ORAL
  Filled 2021-12-03: qty 1

## 2021-12-03 MED ORDER — VALACYCLOVIR HCL 500 MG PO TABS: 500.0000 mg | ORAL_TABLET | Freq: Two times a day (BID) | ORAL | 3 refills | Status: AC

## 2021-12-03 MED ORDER — FLUCONAZOLE 150 MG PO TABS: 150.0000 mg | ORAL_TABLET | ORAL | 0 refills | Status: AC

## 2021-12-03 MED ORDER — METRONIDAZOLE 500 MG PO TABS: 500.0000 mg | ORAL_TABLET | Freq: Two times a day (BID) | ORAL | 0 refills | Status: AC

## 2021-12-03 NOTE — ED Triage Notes (Signed)
Pt arrived via POV, states she ate last night, and felt nauseated and vomited after. States she took phenergan and was feeling better, however it has started to wear off. Requesting nausea and cramping rx.  ?

## 2021-12-03 NOTE — Discharge Instructions (Addendum)
I have sent you in some phenergan - take as needed until your food poisoning symptoms improve. Please drink plenty of water for hydration and eat when you can. ?
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
## 2021-12-03 NOTE — Patient Instructions (Signed)
?  Carol Potter, thank you for joining Carvel Getting, NP for today's virtual visit.  While this provider is not your primary care provider (PCP), if your PCP is located in our provider database this encounter information will be shared with them immediately following your visit. ? ?Consent: ?(Patient) Carol Potter provided verbal consent for this virtual visit at the beginning of the encounter. ? ?Current Medications: ? ?Current Outpatient Medications:  ?  fluconazole (DIFLUCAN) 150 MG tablet, Take 1 tablet (150 mg total) by mouth once a week for 2 doses., Disp: 2 tablet, Rfl: 0 ?  metroNIDAZOLE (FLAGYL) 500 MG tablet, Take 1 tablet (500 mg total) by mouth 2 (two) times daily for 7 days., Disp: 14 tablet, Rfl: 0 ?  valACYclovir (VALTREX) 500 MG tablet, Take 1 tablet (500 mg total) by mouth 2 (two) times daily for 3 days., Disp: 6 tablet, Rfl: 3 ?  EPINEPHrine 0.3 mg/0.3 mL IJ SOAJ injection, Inject 0.3 mg into the muscle as needed for anaphylaxis., Disp: 1 each, Rfl: 0 ?  ondansetron (ZOFRAN ODT) 8 MG disintegrating tablet, Take 1 tablet (8 mg total) by mouth every 8 (eight) hours as needed for nausea or vomiting., Disp: 12 tablet, Rfl: 0 ?  promethazine-dextromethorphan (PROMETHAZINE-DM) 6.25-15 MG/5ML syrup, Take 5 mLs by mouth 4 (four) times daily as needed for cough., Disp: 118 mL, Rfl: 0  ? ?Medications ordered in this encounter:  ?Meds ordered this encounter  ?Medications  ? fluconazole (DIFLUCAN) 150 MG tablet  ?  Sig: Take 1 tablet (150 mg total) by mouth once a week for 2 doses.  ?  Dispense:  2 tablet  ?  Refill:  0  ? metroNIDAZOLE (FLAGYL) 500 MG tablet  ?  Sig: Take 1 tablet (500 mg total) by mouth 2 (two) times daily for 7 days.  ?  Dispense:  14 tablet  ?  Refill:  0  ? valACYclovir (VALTREX) 500 MG tablet  ?  Sig: Take 1 tablet (500 mg total) by mouth 2 (two) times daily for 3 days.  ?  Dispense:  6 tablet  ?  Refill:  3  ?  ? ?*If you need refills on other medications prior to your next  appointment, please contact your pharmacy* ? ?Follow-Up: ?Call back or seek an in-person evaluation if the symptoms worsen or if the condition fails to improve as anticipated. ? ?Other Instructions ?Consider using condoms with your partner for 2-3 months to see if that reduces frequency of vaginitis. You might also consider trying birth control pills to help regulate hormone levels to see if that helps reduce vaginitis episodes.  ? ? ?If you have been instructed to have an in-person evaluation today at a local Urgent Care facility, please use the link below. It will take you to a list of all of our available [facility], including address, phone number and hours of operation. Please do not delay care.  ?[facility] ? ?If you or a family member do not have a primary care provider, use the link below to schedule a visit and establish care. When you choose a [facility] primary care physician or advanced practice provider, you gain a long-term partner in health. ?Find a Primary Care Provider ? ?Learn more about [facility] in-office and virtual care options: ?[facility]  ?

## 2021-12-03 NOTE — ED Provider Notes (Signed)
?[facility] ?Provider Note ? ? ?CSN: [id number] ?Arrival date & time: 12/03/21  1227 ? ?  ? ?History ? ?Chief Complaint  ?Patient presents with  ? Nausea  ? ? ?Carol Potter is a 36 y.o. female who presents to the ED for evaluation of stomach pain and vomiting with suspected food poisoning that started two days ago. Pt has had food poisoning in the past and notes that this feels similar. She had a tablet of phenergan at home which seemed to help, however it has worn off and her symptoms are returning. She has been taking pedialyte for rehydration. She denies diarrhea, urinary symptoms, fever, cough and congestion. ? ?HPI ? ?  ? ?Home Medications ?Prior to Admission medications   ?Medication Sig Start Date End Date Taking? Authorizing Provider  ?promethazine (PHENERGAN) 25 MG tablet Take 1 tablet (25 mg total) by mouth every 6 (six) hours as needed for nausea or vomiting. 12/03/21  Yes Raynald Blend R, PA-C  ?EPINEPHrine 0.3 mg/0.3 mL IJ SOAJ injection Inject 0.3 mg into the muscle as needed for anaphylaxis. 03/25/21   [place], Alvira Philips, DO  ?fluconazole (DIFLUCAN) 150 MG tablet Take 1 tablet (150 mg total) by mouth once a week for 2 doses. 12/03/21 12/11/21  Cathlyn Parsons, NP  ?metroNIDAZOLE (FLAGYL) 500 MG tablet Take 1 tablet (500 mg total) by mouth 2 (two) times daily for 7 days. 12/03/21 12/10/21  Cathlyn Parsons, NP  ?ondansetron (ZOFRAN ODT) 8 MG disintegrating tablet Take 1 tablet (8 mg total) by mouth every 8 (eight) hours as needed for nausea or vomiting. 06/08/21   Linwood Dibbles, MD  ?promethazine-dextromethorphan (PROMETHAZINE-DM) 6.25-15 MG/5ML syrup Take 5 mLs by mouth 4 (four) times daily as needed for cough. 10/01/21   [place], Tylene Fantasia, PA-C  ?valACYclovir (VALTREX) 500 MG tablet Take 1 tablet (500 mg total) by mouth 2 (two) times daily for 3 days. 12/03/21 12/06/21  Cathlyn Parsons, NP  ?   ? ?Allergies    ?Penicillins, Bee venom, and Other   ? ?Review of Systems   ?Review of  Systems ? ?Physical Exam ?Updated Vital Signs ?BP 122/87 (BP Location: Left Arm)   Pulse 80   Temp 98.1 ?F (36.7 ?C) (Oral)   Resp 19   SpO2 100%  ?Physical Exam ?Vitals and nursing note reviewed.  ?Constitutional:   ?   General: She is not in acute distress. ?   Appearance: She is not ill-appearing.  ?HENT:  ?   Head: Atraumatic.  ?Eyes:  ?   Conjunctiva/sclera: Conjunctivae normal.  ?Cardiovascular:  ?   Rate and Rhythm: Normal rate and regular rhythm.  ?   Pulses: Normal pulses.  ?   Heart sounds: No murmur heard. ?Pulmonary:  ?   Effort: Pulmonary effort is normal. No respiratory distress.  ?   Breath sounds: Normal breath sounds.  ?Abdominal:  ?   General: Abdomen is flat. There is no distension.  ?   Palpations: Abdomen is soft.  ?   Tenderness: There is no abdominal tenderness.  ?Musculoskeletal:     ?   General: Normal range of motion.  ?   Cervical back: Normal range of motion.  ?Skin: ?   General: Skin is warm and dry.  ?   Capillary Refill: Capillary refill takes less than 2 seconds.  ?Neurological:  ?   General: No focal deficit present.  ?   Mental Status: She is alert.  ?Psychiatric:     ?  Mood and Affect: Mood normal.  ? ? ?ED Results / Procedures / Treatments   ?Labs ?(all labs ordered are listed, but only abnormal results are displayed) ?Labs Reviewed - No data to display ? ?EKG ?None ? ?Radiology ?No results found. ? ?Procedures ?Procedures  ? ? ?Medications Ordered in ED ?Medications  ?promethazine (PHENERGAN) tablet 25 mg (25 mg Oral Given 12/03/21 1359)  ? ? ?ED Course/ Medical Decision Making/ A&P ?  ?                        ?Medical Decision Making ?Risk ?Prescription drug management. ? ? ?History:  ?Per HPI ?Social determinants of health: none ? ?Initial impression: ? ?This patient presents to the ED for concern of vomiting and abdominal cramping, this involves an extensive number of treatment options, and is a complaint that carries with it a high risk of complications and morbidity.     ?Pt is uncomfortable appearing although nontoxic. Physical exam was overall benign.  Patient is requesting dose of Phenergan and some to take home.  Given that she has normal vitals, benign physical exam and has had symptomatic improvement already using Phenergan, I think this is a reasonable option. ? ?Medicines ordered and prescription drug management: ? ?I ordered medication including: ?Phenergan 25 mg ?Reevaluation of the patient after these medicines showed that the patient improved ?I have reviewed the patients home medicines and have made adjustments as needed ? ? ?Disposition: ? ?After consideration of the diagnostic results, physical exam, history and the patients response to treatment feel that the patent would benefit from discharge.   ?Food poisoning: Patient advised on self-limiting nature of illness.  Phenergan prescription prescribed.  Advised to drastically increase water intake.  Return precautions were discussed and patient was discharged home in good condition. ? ? ?Final Clinical Impression(s) / ED Diagnoses ?Final diagnoses:  ?Food poisoning  ? ? ?Rx / DC Orders ?ED Discharge Orders   ? ?      Ordered  ?  promethazine (PHENERGAN) 25 MG tablet  Every 6 hours PRN       ? 12/03/21 1358  ? ?  ?  ? ?  ? ? ?  ?Janell Quiet, [place] ?12/04/21 2022 ? ?  ?Ernie Avena, MD ?12/04/21 2048 ? ?

## 2022-01-01 ENCOUNTER — Encounter (HOSPITAL_COMMUNITY): Payer: Self-pay

## 2022-01-01 ENCOUNTER — Ambulatory Visit (HOSPITAL_COMMUNITY)
Admission: EM | Admit: 2022-01-01 | Discharge: 2022-01-01 | Disposition: A | Discharge status: Home / Self Care | Payer: Medicare Other | Attending: Family Medicine | Admitting: Family Medicine

## 2022-01-01 DIAGNOSIS — K130 Diseases of lips: Secondary | ICD-10-CM

## 2022-01-01 DIAGNOSIS — J302 Other seasonal allergic rhinitis: Secondary | ICD-10-CM | POA: Diagnosis not present

## 2022-01-01 DIAGNOSIS — N898 Other specified noninflammatory disorders of vagina: Secondary | ICD-10-CM

## 2022-01-01 MED ORDER — CETIRIZINE HCL 10 MG PO TABS: 10.0000 mg | ORAL_TABLET | Freq: Every day | ORAL | 2 refills | Status: DC

## 2022-01-01 MED ORDER — CLOTRIMAZOLE 1 % EX CREA: TOPICAL_CREAM | CUTANEOUS | 0 refills | Status: DC

## 2022-01-01 MED ORDER — FLUCONAZOLE 150 MG PO TABS: ORAL_TABLET | ORAL | 0 refills | Status: DC

## 2022-01-01 MED ORDER — METRONIDAZOLE 500 MG PO TABS: 500.0000 mg | ORAL_TABLET | Freq: Two times a day (BID) | ORAL | 0 refills | Status: DC

## 2022-01-01 NOTE — ED Triage Notes (Signed)
Pt c/o seasonal allergies x3 wks, nasal congestion and cough. ?

## 2022-01-02 NOTE — ED Provider Notes (Signed)
?[facility] ? ? ?[id number] ?01/01/22 Arrival Time: 1942 ? ?ASSESSMENT & PLAN: ? ?1. Seasonal allergies   ?2. Vaginal discharge   ?3. Angular cheilitis   ?- with acute allergy exacerbation ? ?Discharge Medication List as of 01/01/2022  8:23 PM  ?  ? ?START taking these medications  ? Details  ?cetirizine (ZYRTEC ALLERGY) 10 MG tablet Take 1 tablet (10 mg total) by mouth daily., Starting Tue 01/01/2022, Normal  ?  ?clotrimazole (LOTRIMIN) 1 % cream Apply to affected area 2 times daily, Normal  ?  ?fluconazole (DIFLUCAN) 150 MG tablet Take one tablet by mouth as a single dose. May repeat in 3 days if symptoms persist., Normal  ?  ?metroNIDAZOLE (FLAGYL) 500 MG tablet Take 1 tablet (500 mg total) by mouth 2 (two) times daily., Starting Tue 01/01/2022, Normal  ?  ?  ? ?Declines vaginal cytology. ? ? ? Follow-up Information   ? ? [facility].   ?Specialty: Urgent Care ?Why: As needed. ?Contact information: ?[address] ?[place] [address] ?[phone] ? ?  ?  ? ?  ?  ? ?  ? ? ?Reviewed expectations re: course of current medical issues. Questions answered. ?Outlined signs and symptoms indicating need for more acute intervention. ?Understanding verbalized. ?After Visit Summary given. ? ? ?SUBJECTIVE: ?History from: Patient. ?Carol Potter is a 36 y.o. Potter. Reports: seasonal allergy exacerbation; past week. Occas coughing; no SOB. Out of Zyrtec. Denies: fever. Normal PO intake without n/v/d. Also reports dry and cracked lower lip; is painful; past week. ?Also reports vaginal discharge; h/o BV with same symptoms. Not worried re: STI. Mild vaginal itching. ? ? ?OBJECTIVE: ? ?Vitals:  ? 01/01/22 2006  ?BP: 115/81  ?Pulse: 71  ?Resp: 18  ?Temp: 98.3 ?F (36.8 ?C)  ?TempSrc: Oral  ?SpO2: 97%  ?  ?General appearance: alert; no distress ?Eyes: PERRLA; EOMI; conjunctiva normal ?HENT: [place]; AT; with nasal congestion; lower lip dry and cracked with lateral bilateral  fissuring ?Neck: supple  ?Lungs: speaks full sentences without difficulty; unlabored ?Extremities: no edema ?GU: deferred ?Skin: warm and dry ?Neurologic: normal gait ?Psychological: alert and cooperative; normal mood and affect ? ? ? ?Allergies  ?Allergen Reactions  ? Penicillins Itching  ?  Vaginal Yeast infection  ? Bee Venom   ? Other Other (See Comments)  ?  "Vaginal Yeast Infection after taking any antibiotic"  ? ? ?Past Medical History:  ?Diagnosis Date  ? Arthritis   ? 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 Bronchitis   ? ?Social History  ? ?Socioeconomic History  ? Marital status: Single  ?  Spouse name: Not on file  ? Number of children: Not on file  ? Years of education: Not on file  ? Highest education level: Not on file  ?Occupational History  ? Not on file  ?Tobacco Use  ? Smoking status: Some Days  ? Smokeless tobacco: Never  ?Substance and Sexual Activity  ? Alcohol use: Yes  ?  Comment: casual  ? Drug use: Yes  ?  Types: Marijuana  ? Sexual activity: Not on file  ?Other Topics Concern  ? Not on file  ?Social History Narrative  ? Not on file  ? ?Social Determinants of Health  ? ?Financial Resource Strain: Not on file  ?Food Insecurity: Not on file  ?Transportation Needs: Not on file  ?Physical Activity: Not on file  ?Stress: Not on file  ?Social Connections: Not on file  ?  Intimate Partner Violence: Not on file  ? ?Family History  ?Family history unknown: Yes  ? ?Past Surgical History:  ?Procedure Laterality Date  ? arm surgery    ? foot surgery    ? HIP SURGERY    ? ?  ?Mardella Layman, MD ?01/02/22 [phone] ? ?

## 2022-01-07 ENCOUNTER — Emergency Department (HOSPITAL_COMMUNITY)
Admission: EM | Admit: 2022-01-07 | Discharge: 2022-01-07 | Disposition: A | Discharge status: Home / Self Care | Payer: Medicare Other

## 2022-01-07 NOTE — ED Notes (Signed)
Patient went to car never came back ?

## 2022-03-05 ENCOUNTER — Ambulatory Visit (HOSPITAL_COMMUNITY)
Admission: EM | Admit: 2022-03-05 | Discharge: 2022-03-05 | Disposition: A | Discharge status: Home / Self Care | Payer: No Typology Code available for payment source | Attending: Internal Medicine | Admitting: Internal Medicine

## 2022-03-05 ENCOUNTER — Other Ambulatory Visit: Payer: Self-pay

## 2022-03-05 ENCOUNTER — Encounter (HOSPITAL_COMMUNITY): Payer: Self-pay | Admitting: *Deleted

## 2022-03-05 ENCOUNTER — Ambulatory Visit (INDEPENDENT_AMBULATORY_CARE_PROVIDER_SITE_OTHER): Payer: No Typology Code available for payment source

## 2022-03-05 DIAGNOSIS — S29012A Strain of muscle and tendon of back wall of thorax, initial encounter: Secondary | ICD-10-CM | POA: Diagnosis not present

## 2022-03-05 DIAGNOSIS — R079 Chest pain, unspecified: Secondary | ICD-10-CM | POA: Diagnosis not present

## 2022-03-05 DIAGNOSIS — Y929 Unspecified place or not applicable: Secondary | ICD-10-CM | POA: Diagnosis not present

## 2022-03-05 DIAGNOSIS — R071 Chest pain on breathing: Secondary | ICD-10-CM | POA: Diagnosis not present

## 2022-03-05 DIAGNOSIS — N898 Other specified noninflammatory disorders of vagina: Secondary | ICD-10-CM | POA: Diagnosis not present

## 2022-03-05 MED ORDER — IBUPROFEN 600 MG PO TABS: 600.0000 mg | ORAL_TABLET | Freq: Four times a day (QID) | ORAL | 0 refills | Status: DC | PRN

## 2022-03-05 MED ORDER — METHOCARBAMOL 500 MG PO TABS
500.0000 mg | ORAL_TABLET | Freq: Two times a day (BID) | ORAL | 0 refills | Status: DC
Start: 2022-03-05 — End: 2022-03-08

## 2022-03-05 MED ORDER — ACETAMINOPHEN 500 MG PO TABS: 1000.0000 mg | ORAL_TABLET | Freq: Four times a day (QID) | ORAL | 0 refills | Status: DC | PRN
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
## 2022-03-06 ENCOUNTER — Telehealth (HOSPITAL_COMMUNITY): Payer: Self-pay | Admitting: Emergency Medicine

## 2022-03-06 LAB — CERVICOVAGINAL ANCILLARY ONLY
Bacterial Vaginitis (gardnerella): POSITIVE — AB
Candida Glabrata: NEGATIVE
Candida Vaginitis: NEGATIVE
Chlamydia: NEGATIVE
Comment: NEGATIVE
Comment: NEGATIVE
Comment: NEGATIVE
Comment: NEGATIVE
Comment: NEGATIVE
Comment: NORMAL
Neisseria Gonorrhea: NEGATIVE
Trichomonas: NEGATIVE

## 2022-03-06 MED ORDER — METRONIDAZOLE 500 MG PO TABS: 500.0000 mg | ORAL_TABLET | Freq: Two times a day (BID) | ORAL | 0 refills | Status: DC

## 2022-03-06 MED ORDER — FLUCONAZOLE 150 MG PO TABS: 150.0000 mg | ORAL_TABLET | Freq: Once | ORAL | 0 refills | Status: AC

## 2022-03-08 ENCOUNTER — Telehealth (HOSPITAL_COMMUNITY): Payer: Self-pay | Admitting: Family Medicine

## 2022-03-08 MED ORDER — TIZANIDINE HCL 4 MG PO TABS: 4.0000 mg | ORAL_TABLET | Freq: Three times a day (TID) | ORAL | 0 refills | Status: DC | PRN

## 2022-03-21 ENCOUNTER — Ambulatory Visit (INDEPENDENT_AMBULATORY_CARE_PROVIDER_SITE_OTHER): Payer: Medicare Other | Admitting: Family Medicine

## 2022-03-21 ENCOUNTER — Encounter: Payer: Self-pay | Admitting: Family Medicine

## 2022-03-21 VITALS — BP 120/72 | HR 71 | Temp 97.8°F | Ht 60.0 in | Wt 133.0 lb

## 2022-03-21 DIAGNOSIS — R1031 Right lower quadrant pain: Secondary | ICD-10-CM

## 2022-03-21 DIAGNOSIS — R143 Flatulence: Secondary | ICD-10-CM

## 2022-03-21 DIAGNOSIS — N8003 Adenomyosis of the uterus: Secondary | ICD-10-CM | POA: Diagnosis not present

## 2022-03-21 DIAGNOSIS — G8929 Other chronic pain: Secondary | ICD-10-CM | POA: Diagnosis not present

## 2022-03-21 DIAGNOSIS — M25561 Pain in right knee: Secondary | ICD-10-CM

## 2022-03-21 DIAGNOSIS — Z9151 Personal history of suicidal behavior: Secondary | ICD-10-CM

## 2022-03-21 DIAGNOSIS — R45851 Suicidal ideations: Secondary | ICD-10-CM

## 2022-03-21 DIAGNOSIS — F32A Depression, unspecified: Secondary | ICD-10-CM

## 2022-03-21 DIAGNOSIS — N920 Excessive and frequent menstruation with regular cycle: Secondary | ICD-10-CM | POA: Diagnosis not present

## 2022-03-21 LAB — COMPREHENSIVE METABOLIC PANEL
ALT: 15 U/L (ref 0–35)
AST: 17 U/L (ref 0–37)
Albumin: 4.3 g/dL (ref 3.5–5.2)
Alkaline Phosphatase: 44 U/L (ref 39–117)
BUN: 13 mg/dL (ref 6–23)
CO2: 28 mEq/L (ref 19–32)
Calcium: 9.3 mg/dL (ref 8.4–10.5)
Chloride: 106 mEq/L (ref 96–112)
Creatinine, Ser: 0.77 mg/dL (ref 0.40–1.20)
GFR: 98.1 mL/min (ref >60.00)
Glucose, Bld: 87 mg/dL (ref 70–99)
Potassium: 4.4 mEq/L (ref 3.5–5.1)
Sodium: 139 mEq/L (ref 135–145)
Total Bilirubin: 0.4 mg/dL (ref 0.2–1.2)
Total Protein: 6.7 g/dL (ref 6.0–8.3)

## 2022-03-21 LAB — CBC WITH DIFFERENTIAL/PLATELET
Basophils Absolute: 0 10*3/uL (ref 0.0–0.1)
Basophils Relative: 0.7 % (ref 0.0–3.0)
Eosinophils Absolute: 0 10*3/uL (ref 0.0–0.7)
Eosinophils Relative: 0.8 % (ref 0.0–5.0)
HCT: 40.6 % (ref 36.0–46.0)
Hemoglobin: 13 g/dL (ref 12.0–15.0)
Lymphocytes Relative: 30.4 % (ref 12.0–46.0)
Lymphs Abs: 1.4 10*3/uL (ref 0.7–4.0)
MCHC: 32.1 g/dL (ref 30.0–36.0)
MCV: 84.1 fl (ref 78.0–100.0)
Monocytes Absolute: 0.3 10*3/uL (ref 0.1–1.0)
Monocytes Relative: 7.1 % (ref 3.0–12.0)
Neutro Abs: 2.9 10*3/uL (ref 1.4–7.7)
Neutrophils Relative %: 61 % (ref 43.0–77.0)
Platelets: 227 10*3/uL (ref 150.0–400.0)
RBC: 4.82 Mil/uL (ref 3.87–5.11)
RDW: 13.2 % (ref 11.5–15.5)
WBC: 4.7 10*3/uL (ref 4.0–10.5)

## 2022-03-21 LAB — URINALYSIS, ROUTINE W REFLEX MICROSCOPIC
Bilirubin Urine: NEGATIVE
Hgb urine dipstick: NEGATIVE
Ketones, ur: NEGATIVE
Leukocytes,Ua: NEGATIVE
Nitrite: NEGATIVE
Specific Gravity, Urine: 1.025 (ref 1.000–1.030)
Total Protein, Urine: NEGATIVE
Urine Glucose: NEGATIVE
Urobilinogen, UA: 0.2 (ref 0.0–1.0)
pH: 6 (ref 5.0–8.0)

## 2022-03-21 LAB — TSH: TSH: 0.45 u[IU]/mL (ref 0.35–5.50)

## 2022-03-21 NOTE — Progress Notes (Signed)
New Patient Office Visit  Subjective    Patient ID: Carol Potter, female    DOB: July 03, 1986  Age: 36 y.o. MRN: [id number]  CC:  Chief Complaint  Patient presents with   Establish Care    Gas problem in her stpmach that causes a lot of pain especially during her period. Has had tubes tied but states symptoms have worsened since then.   Right leg and neck pain has been going on for a few months now.    HPI Carol Potter presents to establish care.  Moved here from [facility] 3 years ago.   Increased gas and abdominal pain RLQ x 2 years ago.   Gas X and heating pad help  No changes in bowel habits. No blood.   Intermittent knee pain x 1 year. No swelling  Feels like it is getting stuck, possible foreign body.  Right lateral neck pain.   Tubal ligation, ?mass in uterus removed, and 3 c-sections   Right rotator cuff surgery in 2007   Artifical bone surgery in right foot for arch issue.   States she is having a bad day. States she feels like she has been depressed  Hx of suicide attempt 15 years, took pills but someone found her and made her throw up.  Does not want to take medication. She would like to see a mental health specialist.   Lives with her 3 children.  Single mom.   Father of her children has PTSD and does not live with them.   Mother is dead. Doesn't know her father. Sister is not mentally well.   Denies fever, chills, dizziness, chest pain, palpitations, shortness of breath, abdominal pain, N/V/D, urinary symptoms.        03/21/2022   11:19 AM  Depression screen PHQ 2/9  Decreased Interest 2  Down, Depressed, Hopeless 2  PHQ - 2 Score 4  Altered sleeping 3  Tired, decreased energy 2  Change in appetite 2  Feeling bad or failure about yourself  2  Trouble concentrating 3  Moving slowly or fidgety/restless 2  Suicidal thoughts 2  PHQ-9 Score 20  Difficult doing work/chores Very difficult       Outpatient Encounter Medications as of 03/21/2022   Medication Sig   clotrimazole (LOTRIMIN) 1 % cream Apply to affected area 2 times daily   EPINEPHrine 0.3 mg/0.3 mL IJ SOAJ injection Inject 0.3 mg into the muscle as needed for anaphylaxis.   ibuprofen (ADVIL) 600 MG tablet Take 1 tablet (600 mg total) by mouth every 6 (six) hours as needed.   tiZANidine (ZANAFLEX) 4 MG tablet Take 1 tablet (4 mg total) by mouth every 8 (eight) hours as needed for muscle spasms.   [DISCONTINUED] acetaminophen (TYLENOL) 500 MG tablet Take 2 tablets (1,000 mg total) by mouth every 6 (six) hours as needed.   [DISCONTINUED] cetirizine (ZYRTEC ALLERGY) 10 MG tablet Take 1 tablet (10 mg total) by mouth daily.   [DISCONTINUED] metroNIDAZOLE (FLAGYL) 500 MG tablet Take 1 tablet (500 mg total) by mouth 2 (two) times daily.   [DISCONTINUED] ondansetron (ZOFRAN ODT) 8 MG disintegrating tablet Take 1 tablet (8 mg total) by mouth every 8 (eight) hours as needed for nausea or vomiting.   [DISCONTINUED] promethazine (PHENERGAN) 25 MG tablet Take 1 tablet (25 mg total) by mouth every 6 (six) hours as needed for nausea or vomiting.   [DISCONTINUED] promethazine-dextromethorphan (PROMETHAZINE-DM) 6.25-15 MG/5ML syrup Take 5 mLs by mouth 4 (four) times daily as needed for  cough.   No facility-administered encounter medications on file as of 03/21/2022.    Past Medical History:  Diagnosis Date   Arthritis    Brachydactyly    type C   Bronchitis     Past Surgical History:  Procedure Laterality Date   arm surgery     foot surgery     HIP SURGERY      Family History  Family history unknown: Yes    Social History   Socioeconomic History   Marital status: Single    Spouse name: Not on file   Number of children: Not on file   Years of education: Not on file   Highest education level: Not on file  Occupational History   Not on file  Tobacco Use   Smoking status: Some Days   Smokeless tobacco: Never  Substance and Sexual Activity   Alcohol use: Yes    Comment:  casual   Drug use: Yes    Types: Marijuana   Sexual activity: Not on file  Other Topics Concern   Not on file  Social History Narrative   Not on file   Social Determinants of Health   Financial Resource Strain: Not on file  Food Insecurity: Not on file  Transportation Needs: Not on file  Physical Activity: Not on file  Stress: Not on file  Social Connections: Not on file  Intimate Partner Violence: Not on file    ROS Pertinent positives and negatives in the history of present illness.      Objective    BP 120/72 (BP Location: Left Arm, Patient Position: Sitting, Cuff Size: Large)   Pulse 71   Temp 97.8 F (36.6 C) (Temporal)   Ht 5' (1.524 m)   Wt 133 lb (60.3 kg)   LMP 02/19/2022   SpO2 99%   BMI 25.97 kg/m   Physical Exam Constitutional:      General: She is not in acute distress.    Appearance: She is not ill-appearing.  Eyes:     Conjunctiva/sclera: Conjunctivae normal.     Pupils: Pupils are equal, round, and reactive to light.  Cardiovascular:     Rate and Rhythm: Normal rate and regular rhythm.  Pulmonary:     Effort: Pulmonary effort is normal.     Breath sounds: Normal breath sounds.  Abdominal:     General: Bowel sounds are normal. There is no distension.     Palpations: Abdomen is soft.     Tenderness: There is no abdominal tenderness. There is no guarding or rebound.  Musculoskeletal:     Cervical back: Normal range of motion and neck supple.  Skin:    General: Skin is warm and dry.  Neurological:     General: No focal deficit present.     Mental Status: She is alert and oriented to person, place, and time.     Cranial Nerves: Cranial nerves 2-12 are intact.     Motor: Motor function is intact.  Psychiatric:        Mood and Affect: Mood is depressed.        Speech: Speech normal.        Behavior: Behavior normal.        Thought Content: Thought content normal.        Cognition and Memory: Cognition normal.         Assessment &  Plan:   Problem List Items Addressed This Visit       Genitourinary   Adenomyosis of  uterus    Reviewed ED notes with [place] report. Referral to OB/GYN      Relevant Orders   Ambulatory referral to Obstetrics / Gynecology     Other   Chronic pain of right knee    Ongoing. Referral to orthopedist.       Relevant Orders   Ambulatory referral to Orthopedic Surgery   Chronic RLQ pain    Benign exam. Referral to OB/GYN      Relevant Orders   CBC with Differential/Platelet (Completed)   Comprehensive metabolic panel (Completed)   Urinalysis, Routine w reflex microscopic (Completed)   Depression   Relevant Orders   Ambulatory referral to Psychiatry   Flatulence    Avoid gas producing foods when possible and continue with GasX as needed. Follow up in 4 weeks.       Hx of suicide attempt    Denies plan or intent. Verbal contract obtained. Referral to psychiatrist.  Advised to call for help or go to the ED if needed.       Relevant Orders   Ambulatory referral to Psychiatry   Menorrhagia with regular cycle - Primary    Referral to OB/GYN      Relevant Orders   Ambulatory referral to Obstetrics / Gynecology   CBC with Differential/Platelet (Completed)   Comprehensive metabolic panel (Completed)   TSH (Completed)   Suicidal ideations    States she does not have a plan and will not harm herself. Verbal contract obtained that she will not harm herself. States she has 3 children to live for and take care of. Declines medication today. Referral to psychiatrist and follow up here in 4 weeks or sooner if needed.       Relevant Orders   Ambulatory referral to Psychiatry    Return in about 4 weeks (around 04/18/2022).   Hetty Blend, NP-C

## 2022-03-21 NOTE — Patient Instructions (Signed)
Go to the lab on the first floor before you leave today.   I have referred you to an OB/GYN, orthopedist (knee and neck pain) and  Psychiatrist for depression and suicidal thoughts.   If you have worsening suicidal thoughts, please call [phone] or [phone] or go to the emergency department.   Follow up here in 4 weeks    Abdominal Bloating When you have abdominal bloating, your abdomen may feel full, tight, or painful. It may also look bigger than normal or swollen (distended). Common causes of abdominal bloating include: Swallowing air. Constipation. Problems digesting food. Eating too much. Irritable bowel syndrome. This is a condition that affects the large intestine. Lactose intolerance. This is an inability to digest lactose, a natural sugar in dairy products. Celiac disease. This is a condition that affects the ability to digest gluten, a protein found in some grains. Gastroparesis. This is a condition that slows down the movement of food in the stomach and small intestine. It is more common in people with diabetes mellitus. Gastroesophageal reflux disease (GERD). This is a condition that makes stomach acid flow back into the esophagus. Urinary retention. This means that the body is holding onto urine, and the bladder cannot be emptied all the way. Follow these instructions at home: Eating and drinking Avoid eating too much. Try not to swallow air while talking or eating. Avoid eating while lying down. Avoid these foods and drinks: Foods that cause gas, such as broccoli, cabbage, cauliflower, and baked beans. Carbonated drinks. Hard candy. Chewing gum. Medicines Take over-the-counter and prescription medicines only as told by your health care provider. Take probiotic medicines. These medicines contain live bacteria or yeasts that can help digestion. Take coated peppermint oil capsules. General instructions Try to exercise regularly. Exercise may help to relieve bloating that is  caused by gas and relieve constipation. Keep all follow-up visits. This is important. Contact a health care provider if: You have nausea and vomiting. You have diarrhea. You have abdominal pain. You have unusual weight loss or weight gain. You have severe pain, and medicines do not help. Get help right away if: You have chest pain. You have trouble breathing. You have shortness of breath. You have trouble urinating. You have darker urine than normal. You have blood in your stools or have dark, tarry stools. These symptoms may represent a serious problem that is an emergency. Do not wait to see if the symptoms will go away. Get medical help right away. Call your local emergency services (911 in the [place] Do not drive yourself to the hospital. Summary Abdominal bloating means that the abdomen is swollen. Common causes of abdominal bloating are swallowing air, constipation, and problems digesting food. Avoid eating too much and avoid swallowing air. Avoid foods that cause gas, carbonated drinks, hard candy, and chewing gum. This information is not intended to replace advice given to you by your health care provider. Make sure you discuss any questions you have with your health care provider. Document Revised: 04/04/2020 Document Reviewed: 04/04/2020 Elsevier Patient Education  2023 [employer].

## 2022-03-24 NOTE — Assessment & Plan Note (Signed)
States she does not have a plan and will not harm herself. Verbal contract obtained that she will not harm herself. States she has 3 children to live for and take care of. Declines medication today. Referral to psychiatrist and follow up here in 4 weeks or sooner if needed.

## 2022-03-24 NOTE — Assessment & Plan Note (Addendum)
Denies plan or intent. Verbal contract obtained. Referral to psychiatrist.  Advised to call for help or go to the ED if needed.

## 2022-03-24 NOTE — Assessment & Plan Note (Signed)
Ongoing. Referral to orthopedist.

## 2022-03-24 NOTE — Assessment & Plan Note (Signed)
Referral to OB/GYN

## 2022-03-24 NOTE — Assessment & Plan Note (Signed)
Avoid gas producing foods when possible and continue with GasX as needed. Follow up in 4 weeks.

## 2022-03-24 NOTE — Assessment & Plan Note (Signed)
Benign exam. Referral to OB/GYN

## 2022-03-24 NOTE — Assessment & Plan Note (Addendum)
Reviewed ED notes with [place] report. Referral to OB/GYN

## 2022-03-25 ENCOUNTER — Ambulatory Visit (INDEPENDENT_AMBULATORY_CARE_PROVIDER_SITE_OTHER): Payer: Medicare Other

## 2022-03-25 ENCOUNTER — Other Ambulatory Visit: Payer: Self-pay | Admitting: Physician Assistant

## 2022-03-25 ENCOUNTER — Ambulatory Visit (INDEPENDENT_AMBULATORY_CARE_PROVIDER_SITE_OTHER): Payer: Medicare Other | Admitting: Physician Assistant

## 2022-03-25 DIAGNOSIS — M25571 Pain in right ankle and joints of right foot: Secondary | ICD-10-CM

## 2022-03-25 DIAGNOSIS — M25561 Pain in right knee: Secondary | ICD-10-CM
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
## 2022-03-26 ENCOUNTER — Ambulatory Visit
Admission: RE | Admit: 2022-03-26 | Discharge: 2022-03-26 | Disposition: A | Discharge status: Home / Self Care | Payer: Medicare Other | Source: Ambulatory Visit | Attending: Physician Assistant | Admitting: Physician Assistant

## 2022-03-26 DIAGNOSIS — M25561 Pain in right knee: Secondary | ICD-10-CM

## 2022-03-29 ENCOUNTER — Telehealth: Payer: Self-pay | Admitting: Orthopedic Surgery

## 2022-03-29 NOTE — Telephone Encounter (Signed)
Called patient left voicemail message to return call to schedule an MRI review with Dr. August Saucer

## 2022-03-31 ENCOUNTER — Other Ambulatory Visit: Payer: Medicare Other

## 2022-04-04 ENCOUNTER — Ambulatory Visit (HOSPITAL_COMMUNITY)
Admission: EM | Admit: 2022-04-04 | Discharge: 2022-04-04 | Disposition: A | Discharge status: Home / Self Care | Payer: Medicare Other | Attending: Emergency Medicine | Admitting: Emergency Medicine

## 2022-04-04 DIAGNOSIS — J029 Acute pharyngitis, unspecified: Secondary | ICD-10-CM | POA: Insufficient documentation

## 2022-04-04 DIAGNOSIS — M79641 Pain in right hand: Secondary | ICD-10-CM | POA: Insufficient documentation

## 2022-04-04 DIAGNOSIS — F419 Anxiety disorder, unspecified: Secondary | ICD-10-CM | POA: Diagnosis present

## 2022-04-04 DIAGNOSIS — N760 Acute vaginitis: Secondary | ICD-10-CM | POA: Insufficient documentation

## 2022-04-04 DIAGNOSIS — N898 Other specified noninflammatory disorders of vagina: Secondary | ICD-10-CM | POA: Insufficient documentation

## 2022-04-04 DIAGNOSIS — B9689 Other specified bacterial agents as the cause of diseases classified elsewhere: Secondary | ICD-10-CM | POA: Insufficient documentation

## 2022-04-04 LAB — POCT RAPID STREP A, ED / UC: Streptococcus, Group A Screen (Direct): NEGATIVE

## 2022-04-04 MED ORDER — FLUCONAZOLE 150 MG PO TABS: 150.0000 mg | ORAL_TABLET | Freq: Once | ORAL | 0 refills | Status: DC | PRN

## 2022-04-04 MED ORDER — METRONIDAZOLE 500 MG PO TABS: 500.0000 mg | ORAL_TABLET | Freq: Two times a day (BID) | ORAL | 0 refills | Status: AC

## 2022-04-04 NOTE — ED Provider Notes (Signed)
[facility]    CSN: [id number] Arrival date & time: 04/04/22  1452      History   Chief Complaint Chief Complaint  Patient presents with   Anxiety   Vaginal Discharge   Hand Pain   Sore Throat    HPI Carol Potter is a 36 y.o. female.  Presents with multiple concerns. Increased vaginal discharge, irritation, odor.  History of recurrent BV and is requesting treatment today.  Also feels she has a yeast infection.  Denies any bleeding, spotting, urinary symptoms.  LMP 7/6  Additionally reports sore throat.  She has pain with swallowing.  Reports she has been "yelling and hollering" a lot the last few days.  No fevers, chills, nasal congestion, cough.  No shortness of breath or trouble breathing.  3 days ago she punched a wall with the palm of her right hand.  Reports some pain and a bruise to the area.  She states she knows its not broken and declines x-ray imaging today.  Has been taking ibuprofen with relief.  Has been able to move her hand without pain.  Reports increased anxiety and depression due to family issues. Denies SI/HI. Hx of SI and depression. Does not take medicine.  Past Medical History:  Diagnosis Date   Arthritis    Brachydactyly    type C   Bronchitis     Patient Active Problem List   Diagnosis Date Noted   Menorrhagia with regular cycle 03/21/2022   Adenomyosis of uterus 03/21/2022   Suicidal ideations 03/21/2022   Chronic pain of right knee 03/21/2022   Depression 03/21/2022   Hx of suicide attempt 03/21/2022   Chronic RLQ pain 03/21/2022   Flatulence 03/21/2022    Past Surgical History:  Procedure Laterality Date   arm surgery     foot surgery     HIP SURGERY      OB History   No obstetric history on file.      Home Medications    Prior to Admission medications   Medication Sig Start Date End Date Taking? Authorizing Provider  fluconazole (DIFLUCAN) 150 MG tablet Take 1 tablet (150 mg total) by mouth once as needed  for up to 2 doses (take one pill on day 1, and the second pill 3 days later if symptoms do not improve). 04/04/22  Yes Adline Kirshenbaum, Lurena Joiner, PA-C  metroNIDAZOLE (FLAGYL) 500 MG tablet Take 1 tablet (500 mg total) by mouth 2 (two) times daily for 7 days. 04/04/22 04/11/22 Yes Colton Engdahl, Lurena Joiner, PA-C  clotrimazole (LOTRIMIN) 1 % cream Apply to affected area 2 times daily 01/01/22   Mardella Layman, MD  EPINEPHrine 0.3 mg/0.3 mL IJ SOAJ injection Inject 0.3 mg into the muscle as needed for anaphylaxis. 03/25/21   [place], Alvira Philips, DO  ibuprofen (ADVIL) 600 MG tablet Take 1 tablet (600 mg total) by mouth every 6 (six) hours as needed. 03/05/22   Carlisle Beers, FNP  tiZANidine (ZANAFLEX) 4 MG tablet Take 1 tablet (4 mg total) by mouth every 8 (eight) hours as needed for muscle spasms. 03/08/22   Zenia Resides, MD    Family History Family History  Family history unknown: Yes    Social History Social History   Tobacco Use   Smoking status: Some Days   Smokeless tobacco: Never  Substance Use Topics   Alcohol use: Yes    Comment: casual   Drug use: Yes    Types: Marijuana     Allergies   Penicillins,  Bee venom, and Other   Review of Systems Review of Systems  Genitourinary:  Positive for vaginal discharge.     Physical Exam Triage Vital Signs ED Triage Vitals [04/04/22 1503]  Enc Vitals Group     BP 108/68     Pulse Rate 85     Resp 18     Temp 98.9 F (37.2 C)     Temp Source Oral     SpO2 98 %     Weight      Height      Head Circumference      Peak Flow      Pain Score      Pain Loc      Pain Edu?      Excl. in GC?    No data found.  Updated Vital Signs BP 108/68 (BP Location: Left Arm)   Pulse 85   Temp 98.9 F (37.2 C) (Oral)   Resp 18   LMP 02/19/2022   SpO2 98%   Physical Exam Vitals and nursing note reviewed.  HENT:     Mouth/Throat:     Mouth: Mucous membranes are moist.     Pharynx: Oropharynx is clear. No pharyngeal swelling, oropharyngeal  exudate or posterior oropharyngeal erythema.  Eyes:     Conjunctiva/sclera: Conjunctivae normal.  Cardiovascular:     Rate and Rhythm: Normal rate and regular rhythm.     Heart sounds: Normal heart sounds.  Pulmonary:     Effort: Pulmonary effort is normal.     Breath sounds: Normal breath sounds.  Abdominal:     Tenderness: There is no abdominal tenderness.  Musculoskeletal:     Comments: No bony tenderness right hand, no ecchymosis or contusion. Full ROM, sensation intact.  Psychiatric:        Speech: Speech is rapid and pressured and tangential.    UC Treatments / Results  Labs (all labs ordered are listed, but only abnormal results are displayed) Labs Reviewed  POCT RAPID STREP A, ED / UC  CERVICOVAGINAL ANCILLARY ONLY   EKG  Radiology No results found.  Procedures Procedures  Medications Ordered in UC Medications - No data to display  Initial Impression / Assessment and Plan / UC Course  I have reviewed the triage vital signs and the nursing notes.  Pertinent labs & imaging results that were available during my care of the patient were reviewed by me and considered in my medical decision making (see chart for details).  Cytology swab pending.  She is requesting treatment for BV and yeast at this time.  Medicines sent to pharmacy. Strep test negative. She does not have bony tenderness of the hand and has declined x-ray imaging today.  Recommend continue ibuprofen, return if she desires x-ray. Provided information for the behavioral health urgent care.  Recommend contacting them to set up with a psychologist if needed.  She can also utilize their services if she develops worsening anxiety or depression. Return precautions discussed. Patient agrees to plan and is discharged in stable condition.  Final Clinical Impressions(s) / UC Diagnoses   Final diagnoses:  Vaginal discharge  Sore throat  BV (bacterial vaginosis)  Right hand pain  Anxiety     Discharge  Instructions      Take the flagyl twice daily for 7 days. You can take the fluconazole one dose and the second dose 3 days later.  We will call you if anything else returns positive on your swab.  Try tylenol and/or ibuprofen for  the hand pain. You can return if symptoms worsen and you would like an xray.    ED Prescriptions     Medication Sig Dispense Auth. Provider   metroNIDAZOLE (FLAGYL) 500 MG tablet Take 1 tablet (500 mg total) by mouth 2 (two) times daily for 7 days. 14 tablet Akin Yi, PA-C   fluconazole (DIFLUCAN) 150 MG tablet Take 1 tablet (150 mg total) by mouth once as needed for up to 2 doses (take one pill on day 1, and the second pill 3 days later if symptoms do not improve). 2 tablet Karam Dunson, Lurena Joiner, PA-C      PDMP not reviewed this encounter.   Marlow Baars, [place] 04/04/22 1548

## 2022-04-04 NOTE — Discharge Instructions (Addendum)
Take the flagyl twice daily for 7 days. You can take the fluconazole one dose and the second dose 3 days later.  We will call you if anything else returns positive on your swab.  Try tylenol and/or ibuprofen for the hand pain. You can return if symptoms worsen and you would like an xray.

## 2022-04-04 NOTE — ED Triage Notes (Signed)
Pt presents with increased anxiety and depression since her daughter ran away last week. Pt reports sore throat x 2 days. Pt reports right hand pain after hitting a wall 2-3 days ago. Pt reports having vaginal irritation.

## 2022-04-05 LAB — CERVICOVAGINAL ANCILLARY ONLY
Bacterial Vaginitis (gardnerella): POSITIVE — AB
Candida Glabrata: NEGATIVE
Candida Vaginitis: NEGATIVE
Chlamydia: NEGATIVE
Comment: NEGATIVE
Comment: NEGATIVE
Comment: NEGATIVE
Comment: NEGATIVE
Comment: NEGATIVE
Comment: NORMAL
Neisseria Gonorrhea: NEGATIVE
Trichomonas: NEGATIVE

## 2022-04-26 ENCOUNTER — Telehealth: Payer: Medicare Other | Admitting: Physician Assistant

## 2022-04-26 ENCOUNTER — Telehealth: Payer: Medicare Other | Admitting: Family Medicine

## 2022-04-26 DIAGNOSIS — N760 Acute vaginitis: Secondary | ICD-10-CM | POA: Diagnosis not present

## 2022-04-26 DIAGNOSIS — B9689 Other specified bacterial agents as the cause of diseases classified elsewhere: Secondary | ICD-10-CM

## 2022-04-26 DIAGNOSIS — B3731 Acute candidiasis of vulva and vagina: Secondary | ICD-10-CM

## 2022-04-26 DIAGNOSIS — T3695XA Adverse effect of unspecified systemic antibiotic, initial encounter: Secondary | ICD-10-CM

## 2022-04-26 DIAGNOSIS — B379 Candidiasis, unspecified: Secondary | ICD-10-CM

## 2022-04-26 DIAGNOSIS — B009 Herpesviral infection, unspecified: Secondary | ICD-10-CM

## 2022-04-26 DIAGNOSIS — A6004 Herpesviral vulvovaginitis: Secondary | ICD-10-CM

## 2022-04-26 MED ORDER — METRONIDAZOLE 500 MG PO TABS: 500.0000 mg | ORAL_TABLET | Freq: Two times a day (BID) | ORAL | 0 refills | Status: AC

## 2022-04-26 MED ORDER — VALACYCLOVIR HCL 500 MG PO TABS: 500.0000 mg | ORAL_TABLET | Freq: Two times a day (BID) | ORAL | 0 refills | Status: DC

## 2022-04-26 MED ORDER — FLUCONAZOLE 200 MG PO TABS: 200.0000 mg | ORAL_TABLET | Freq: Once | ORAL | 0 refills | Status: AC

## 2022-04-26 NOTE — Progress Notes (Signed)
Virtual Visit Consent   Carol Potter, you are scheduled for a virtual visit with a [facility] provider today. Just as with appointments in the office, your consent must be obtained to participate. Your consent will be active for this visit and any virtual visit you may have with one of our providers in the next 365 days. If you have a MyChart account, a copy of this consent can be sent to you electronically.  As this is a virtual visit, video technology does not allow for your provider to perform a traditional examination. This may limit your provider's ability to fully assess your condition. If your provider identifies any concerns that need to be evaluated in person or the need to arrange testing (such as labs, EKG, etc.), we will make arrangements to do so. Although advances in technology are sophisticated, we cannot ensure that it will always work on either your end or our end. If the connection with a video visit is poor, the visit may have to be switched to a telephone visit. With either a video or telephone visit, we are not always able to ensure that we have a secure connection.  By engaging in this virtual visit, you consent to the provision of healthcare and authorize for your insurance to be billed (if applicable) for the services provided during this visit. Depending on your insurance coverage, you may receive a charge related to this service.  I need to obtain your verbal consent now. Are you willing to proceed with your visit today? Carol Potter has provided verbal consent on 04/26/2022 for a virtual visit (video or telephone). Margaretann Loveless, PA-C  Date: 04/26/2022 10:24 AM  Virtual Visit via Video Note   I, Margaretann Loveless, connected with  Carol Potter  ([id number], Aug 07, 1986) on 04/26/22 at 10:15 AM EDT by a video-enabled telemedicine application and verified that I am speaking with the correct person using two identifiers.  Location: Patient: Virtual Visit  Location Patient: Mobile Provider: Virtual Visit Location Provider: Home Office   I discussed the limitations of evaluation and management by telemedicine and the availability of in person appointments. The patient expressed understanding and agreed to proceed.    History of Present Illness: Carol Potter is a 36 y.o. who identifies as a female who was assigned female at birth, and is being seen today for vaginal discharge and refill of medication for genital herpes flare.  HPI: Vaginal Discharge The patient's primary symptoms include genital itching, a genital odor, a genital rash and vaginal discharge. This is a recurrent problem. The current episode started in the past 7 days. The problem occurs constantly. The problem has been gradually worsening. The vaginal discharge was white, thin and malodorous. There has been no bleeding.   Was on vacation in [place] and had been sweating due to heat, in and out of hot tubs, had to use a washing powder for clothes she does not normally use, and had to use hotel soap. Having increased irritation and discharge. Gets yeast infections from antibiotics, so requesting fluconazole. Also, would like a refill of her valtrex for genital herpes.   Problems:  Patient Active Problem List   Diagnosis Date Noted   Menorrhagia with regular cycle 03/21/2022   Adenomyosis of uterus 03/21/2022   Suicidal ideations 03/21/2022   Chronic pain of right knee 03/21/2022   Depression 03/21/2022   Hx of suicide attempt 03/21/2022   Chronic RLQ pain 03/21/2022   Flatulence 03/21/2022  Allergies:  Allergies  Allergen Reactions   Penicillins Itching    Vaginal Yeast infection   Bee Venom    Other Other (See Comments)    "Vaginal Yeast Infection after taking any antibiotic"   Medications:  Current Outpatient Medications:    fluconazole (DIFLUCAN) 200 MG tablet, Take 1 tablet (200 mg total) by mouth once for 1 dose., Disp: 1 tablet, Rfl: 0   metroNIDAZOLE  (FLAGYL) 500 MG tablet, Take 1 tablet (500 mg total) by mouth 2 (two) times daily for 7 days., Disp: 14 tablet, Rfl: 0   valACYclovir (VALTREX) 500 MG tablet, Take 1 tablet (500 mg total) by mouth 2 (two) times daily., Disp: 60 tablet, Rfl: 0   clotrimazole (LOTRIMIN) 1 % cream, Apply to affected area 2 times daily, Disp: 15 g, Rfl: 0   EPINEPHrine 0.3 mg/0.3 mL IJ SOAJ injection, Inject 0.3 mg into the muscle as needed for anaphylaxis., Disp: 1 each, Rfl: 0   ibuprofen (ADVIL) 600 MG tablet, Take 1 tablet (600 mg total) by mouth every 6 (six) hours as needed., Disp: 30 tablet, Rfl: 0   tiZANidine (ZANAFLEX) 4 MG tablet, Take 1 tablet (4 mg total) by mouth every 8 (eight) hours as needed for muscle spasms., Disp: 30 tablet, Rfl: 0  Observations/Objective: Patient is well-developed, well-nourished in no acute distress.  Resting comfortably at home.  Head is normocephalic, atraumatic.  No labored breathing.  Speech is clear and coherent with logical content.  Patient is alert and oriented at baseline.    Assessment and Plan: 1. BV (bacterial vaginosis) - metroNIDAZOLE (FLAGYL) 500 MG tablet; Take 1 tablet (500 mg total) by mouth 2 (two) times daily for 7 days.  Dispense: 14 tablet; Refill: 0  2. Antibiotic-induced yeast infection - fluconazole (DIFLUCAN) 200 MG tablet; Take 1 tablet (200 mg total) by mouth once for 1 dose.  Dispense: 1 tablet; Refill: 0  3. Herpes simplex vulvovaginitis - valACYclovir (VALTREX) 500 MG tablet; Take 1 tablet (500 mg total) by mouth 2 (two) times daily.  Dispense: 60 tablet; Refill: 0  - Metronidazole for recurrent BV, last treated on 04/04/22 - Diflucan given as prophylaxis as patient tends to get vaginal yeast infections with antibiotic use. - Valtrex for genital herpes - Seek in person evaluation if not improving or if symptoms worsen  Follow Up Instructions: I discussed the assessment and treatment plan with the patient. The patient was provided an  opportunity to ask questions and all were answered. The patient agreed with the plan and demonstrated an understanding of the instructions.  A copy of instructions were sent to the patient via MyChart unless otherwise noted below.    The patient was advised to call back or seek an in-person evaluation if the symptoms worsen or if the condition fails to improve as anticipated.  Time:  I spent 10 minutes with the patient via telehealth technology discussing the above problems/concerns.    Margaretann Loveless, PA-C

## 2022-04-26 NOTE — Progress Notes (Signed)
   Thank you for the details you included in the comment boxes. Those details are very helpful in determining the best course of treatment for you and help [place] to provide the best care.Because Ms. Carol Potter, we recommend that you convert this visit to a video visit in order for the provider to better assess what is going on.  The provider will be able to give you a more accurate diagnosis and treatment plan if we can more freely discuss your symptoms and with the addition of a virtual examination.   If you convert to a video visit, we will bill your insurance (similar to an office visit) and you will not be charged for this e-Visit. You will be able to stay at home and speak with the first available [facility] advanced practice provider. The link to do a video visit is in the drop down Menu tab of your Welcome screen in MyChart.

## 2022-04-26 NOTE — Patient Instructions (Signed)
Carol Potter, thank you for joining Carol Loveless, PA-C for today's virtual visit.  While this provider is not your primary care provider (PCP), if your PCP is located in our provider database this encounter information will be shared with them immediately following your visit.  Consent: (Patient) Carol Potter provided verbal consent for this virtual visit at the beginning of the encounter.  Current Medications:  Current Outpatient Medications:    fluconazole (DIFLUCAN) 200 MG tablet, Take 1 tablet (200 mg total) by mouth once for 1 dose., Disp: 1 tablet, Rfl: 0   metroNIDAZOLE (FLAGYL) 500 MG tablet, Take 1 tablet (500 mg total) by mouth 2 (two) times daily for 7 days., Disp: 14 tablet, Rfl: 0   valACYclovir (VALTREX) 500 MG tablet, Take 1 tablet (500 mg total) by mouth 2 (two) times daily., Disp: 60 tablet, Rfl: 0   clotrimazole (LOTRIMIN) 1 % cream, Apply to affected area 2 times daily, Disp: 15 g, Rfl: 0   EPINEPHrine 0.3 mg/0.3 mL IJ SOAJ injection, Inject 0.3 mg into the muscle as needed for anaphylaxis., Disp: 1 each, Rfl: 0   ibuprofen (ADVIL) 600 MG tablet, Take 1 tablet (600 mg total) by mouth every 6 (six) hours as needed., Disp: 30 tablet, Rfl: 0   tiZANidine (ZANAFLEX) 4 MG tablet, Take 1 tablet (4 mg total) by mouth every 8 (eight) hours as needed for muscle spasms., Disp: 30 tablet, Rfl: 0   Medications ordered in this encounter:  Meds ordered this encounter  Medications   metroNIDAZOLE (FLAGYL) 500 MG tablet    Sig: Take 1 tablet (500 mg total) by mouth 2 (two) times daily for 7 days.    Dispense:  14 tablet    Refill:  0    Order Specific Question:   Supervising Provider    Answer:   MILLER, BRIAN [[id number]]   fluconazole (DIFLUCAN) 200 MG tablet    Sig: Take 1 tablet (200 mg total) by mouth once for 1 dose.    Dispense:  1 tablet    Refill:  0    Order Specific Question:   Supervising Provider    Answer:   MILLER, BRIAN [[id number]]   valACYclovir (VALTREX) 500  MG tablet    Sig: Take 1 tablet (500 mg total) by mouth 2 (two) times daily.    Dispense:  60 tablet    Refill:  0    Order Specific Question:   Supervising Provider    Answer:   Hyacinth Meeker, BRIAN [[id number]]     *If you need refills on other medications prior to your next appointment, please contact your pharmacy*  Follow-Up: Call back or seek an in-person evaluation if the symptoms worsen or if the condition fails to improve as anticipated.  Other Instructions  Bacterial Vaginosis  Bacterial vaginosis is an infection that occurs when the normal balance of bacteria in the vagina changes. This change is caused by an overgrowth of certain bacteria in the vagina. Bacterial vaginosis is the most common vaginal infection among females aged 50 to 19 years. This condition increases the risk of sexually transmitted infections (STIs). Treatment can help reduce this risk. Treatment is very important for pregnant women because this condition can cause babies to be born early (prematurely) or at a low birth weight. What are the causes? This condition is caused by an increase in harmful bacteria that are normally present in small amounts in the vagina. However, the exact reason this condition develops is not known. You cannot  get bacterial vaginosis from toilet seats, bedding, swimming pools, or contact with objects around you. What increases the risk? The following factors may make you more likely to develop this condition: Having a new sexual partner or multiple sexual partners, or having unprotected sex. Douching. Having an intrauterine device (IUD). Smoking. Abusing drugs and alcohol. This may lead to riskier sexual behavior. Taking certain antibiotic medicines. Being pregnant. What are the signs or symptoms? Some women with this condition have no symptoms. Symptoms may include: Wallace Cullens or white vaginal discharge. The discharge can be watery or foamy. A fish-like odor with discharge, especially after  sex or during menstruation. Itching in and around the vagina. Burning or pain with urination. How is this diagnosed? This condition is diagnosed based on: Your medical history. A physical exam of the vagina. Checking a sample of vaginal fluid for harmful bacteria or abnormal cells. How is this treated? This condition is treated with antibiotic medicines. These may be given as a pill, a vaginal cream, or a medicine that is put into the vagina (suppository). If the condition comes back after treatment, a second round of antibiotics may be needed. Follow these instructions at home: Medicines Take or apply over-the-counter and prescription medicines only as told by your health care provider. Take or apply your antibiotic medicine as told by your health care provider. Do not stop using the antibiotic even if you start to feel better. General instructions If you have a female sexual partner, tell her that you have a vaginal infection. She should follow up with her health care provider. If you have a female sexual partner, he does not need treatment. Avoid sexual activity until you finish treatment. Drink enough fluid to keep your urine pale yellow. Keep the area around your vagina and rectum clean. Wash the area daily with warm water. Wipe yourself from front to back after using the toilet. If you are breastfeeding, talk to your health care provider about continuing breastfeeding during treatment. Keep all follow-up visits. This is important. How is this prevented? Self-care Do not douche. Wash the outside of your vagina with warm water only. Wear cotton or cotton-lined underwear. Avoid wearing tight pants and pantyhose, especially during the summer. Safe sex Use protection when having sex. This includes: Using condoms. Using dental dams. This is a thin layer of a material made of latex or polyurethane that protects the mouth during oral sex. Limit the number of sexual partners. To help  prevent bacterial vaginosis, it is best to have sex with just one partner (monogamous relationship). Make sure you and your sexual partner are tested for STIs. Drugs and alcohol Do not use any products that contain nicotine or tobacco. These products include cigarettes, chewing tobacco, and vaping devices, such as e-cigarettes. If you need help quitting, ask your health care provider. Do not use drugs. Do not drink alcohol if: Your health care provider tells you not to do this. You are pregnant, may be pregnant, or are planning to become pregnant. If you drink alcohol: Limit how much you have to 0-1 drink a day. Be aware of how much alcohol is in your drink. In the [place] one drink equals one 12 oz bottle of beer (355 mL), one 5 oz glass of wine (148 mL), or one 1 oz glass of hard liquor (44 mL). Where to find more information Centers for Disease Control and Prevention: FootballExhibition.com.br American Sexual Health Association (ASHA): www.ashastd.org U.S. Department of Health and Health and safety inspector, Office on Women's Health: http://hoffman.com/  Contact a health care provider if: Your symptoms do not improve, even after treatment. You have more discharge or pain when urinating. You have a fever or chills. You have pain in your abdomen or pelvis. You have pain during sex. You have vaginal bleeding between menstrual periods. Summary Bacterial vaginosis is a vaginal infection that occurs when the normal balance of bacteria in the vagina changes. It results from an overgrowth of certain bacteria. This condition increases the risk of sexually transmitted infections (STIs). Getting treated can help reduce this risk. Treatment is very important for pregnant women because this condition can cause babies to be born early (prematurely) or at low birth weight. This condition is treated with antibiotic medicines. These may be given as a pill, a vaginal cream, or a medicine that is put into the vagina  (suppository). This information is not intended to replace advice given to you by your health care provider. Make sure you discuss any questions you have with your health care provider. Document Revised: 03/02/2020 Document Reviewed: 03/02/2020 Elsevier Patient Education  2023 Elsevier Inc.    If you have been instructed to have an in-person evaluation today at a local Urgent Care facility, please use the link below. It will take you to a list of all of our available [facility], including address, phone number and hours of operation. Please do not delay care.  [facility]  If you or a family member do not have a primary care provider, use the link below to schedule a visit and establish care. When you choose a [facility] primary care physician or advanced practice provider, you gain a long-term partner in health. Find a Primary Care Provider  Learn more about [facility]'s in-office and virtual care options: [facility] - Get Care Now

## 2022-05-01 ENCOUNTER — Ambulatory Visit (HOSPITAL_COMMUNITY): Payer: Medicare Other | Admitting: Student in an Organized Health Care Education/Training Program

## 2022-05-15 ENCOUNTER — Telehealth: Payer: Self-pay

## 2022-05-15 NOTE — Telephone Encounter (Signed)
ROI forms received from [employer]. They request a CD with records so form was sent off to medical records to complete request.

## 2022-05-23 ENCOUNTER — Other Ambulatory Visit (HOSPITAL_COMMUNITY)
Admission: RE | Admit: 2022-05-23 | Discharge: 2022-05-23 | Disposition: A | Discharge status: Home / Self Care | Payer: Medicare Other | Source: Ambulatory Visit | Attending: Obstetrics & Gynecology | Admitting: Obstetrics & Gynecology

## 2022-05-23 ENCOUNTER — Ambulatory Visit (INDEPENDENT_AMBULATORY_CARE_PROVIDER_SITE_OTHER): Payer: Medicare Other | Admitting: Obstetrics & Gynecology

## 2022-05-23 ENCOUNTER — Encounter: Payer: Self-pay | Admitting: Obstetrics & Gynecology

## 2022-05-23 VITALS — BP 112/78 | HR 73 | Ht 60.0 in | Wt 135.0 lb

## 2022-05-23 DIAGNOSIS — Z7251 High risk heterosexual behavior: Secondary | ICD-10-CM | POA: Diagnosis not present

## 2022-05-23 DIAGNOSIS — Z113 Encounter for screening for infections with a predominantly sexual mode of transmission: Secondary | ICD-10-CM

## 2022-05-23 DIAGNOSIS — Z01419 Encounter for gynecological examination (general) (routine) without abnormal findings: Secondary | ICD-10-CM | POA: Diagnosis not present

## 2022-05-23 DIAGNOSIS — Z1151 Encounter for screening for human papillomavirus (HPV): Secondary | ICD-10-CM | POA: Insufficient documentation

## 2022-05-23 DIAGNOSIS — Z124 Encounter for screening for malignant neoplasm of cervix: Secondary | ICD-10-CM

## 2022-05-23 NOTE — Progress Notes (Signed)
New patient presents for AEX. Last Pap: unknown  Last Mammogram: not due Contraception: Tubal Ligation 2017 Vaginal/Urinary Symptoms: Reports recurrent BV, and yeast symptoms today. STD Screen: Desires swab and blood work Other Concerns: Pain LEFT breast for about 9 months, correlates with cycles.

## 2022-05-24 LAB — CERVICOVAGINAL ANCILLARY ONLY
Bacterial Vaginitis (gardnerella): POSITIVE — AB
Candida Glabrata: NEGATIVE
Candida Vaginitis: NEGATIVE
Chlamydia: NEGATIVE
Comment: NEGATIVE
Comment: NEGATIVE
Comment: NEGATIVE
Comment: NEGATIVE
Comment: NEGATIVE
Comment: NORMAL
Neisseria Gonorrhea: NEGATIVE
Trichomonas: NEGATIVE

## 2022-05-28 ENCOUNTER — Encounter: Payer: Self-pay | Admitting: Obstetrics & Gynecology

## 2022-05-28 LAB — CYTOLOGY - PAP
Comment: NEGATIVE
Diagnosis: NEGATIVE
High risk HPV: NEGATIVE

## 2022-05-30 ENCOUNTER — Other Ambulatory Visit: Payer: Self-pay | Admitting: Emergency Medicine

## 2022-05-30 DIAGNOSIS — N898 Other specified noninflammatory disorders of vagina: Secondary | ICD-10-CM

## 2022-05-30 DIAGNOSIS — B9689 Other specified bacterial agents as the cause of diseases classified elsewhere: Secondary | ICD-10-CM

## 2022-05-30 MED ORDER — METRONIDAZOLE 500 MG PO TABS: 500.0000 mg | ORAL_TABLET | Freq: Two times a day (BID) | ORAL | 0 refills | Status: DC

## 2022-05-30 MED ORDER — FLUCONAZOLE 150 MG PO TABS: 150.0000 mg | ORAL_TABLET | Freq: Once | ORAL | 0 refills | Status: AC

## 2022-05-30 NOTE — Progress Notes (Signed)
Rx for BV, Rx for vaginal itching with antibiotic use.

## 2022-06-06 NOTE — Progress Notes (Signed)
Patient ID: Carol Potter, female   DOB: 07-18-1986, 36 y.o.   MRN: [id number]  Chief Complaint  Patient presents with   Gynecologic Exam    HPI Carol T Manz is a 36 y.o. female.  [id number] Patient's last menstrual period was 05/17/2022 (approximate). S/p BTL, for annual examination HPI  Past Medical History:  Diagnosis Date   Arthritis    Brachydactyly    type C   Bronchitis     Past Surgical History:  Procedure Laterality Date   arm surgery     foot surgery     HIP SURGERY     VAGINA SURGERY      Family History  Family history unknown: Yes    Social History Social History   Tobacco Use   Smoking status: Some Days   Smokeless tobacco: Never  Vaping Use   Vaping Use: Never used  Substance Use Topics   Alcohol use: Yes    Comment: casual   Drug use: Yes    Types: Marijuana    Allergies  Allergen Reactions   Penicillins Itching    Vaginal Yeast infection   Bee Venom    Other Other (See Comments)    "Vaginal Yeast Infection after taking any antibiotic"    Current Outpatient Medications  Medication Sig Dispense Refill   EPINEPHrine 0.3 mg/0.3 mL IJ SOAJ injection Inject 0.3 mg into the muscle as needed for anaphylaxis. 1 each 0   ibuprofen (ADVIL) 600 MG tablet Take 1 tablet (600 mg total) by mouth every 6 (six) hours as needed. 30 tablet 0   valACYclovir (VALTREX) 500 MG tablet Take 1 tablet (500 mg total) by mouth 2 (two) times daily. 60 tablet 0   clotrimazole (LOTRIMIN) 1 % cream Apply to affected area 2 times daily 15 g 0   metroNIDAZOLE (FLAGYL) 500 MG tablet Take 1 tablet (500 mg total) by mouth 2 (two) times daily. 14 tablet 0   tiZANidine (ZANAFLEX) 4 MG tablet Take 1 tablet (4 mg total) by mouth every 8 (eight) hours as needed for muscle spasms. 30 tablet 0   No current facility-administered medications for this visit.    Review of Systems Review of Systems  Constitutional: Negative.   Respiratory: Negative.    Cardiovascular:  Negative.   Genitourinary:  Positive for vaginal discharge. Negative for menstrual problem and pelvic pain.       Cyclic breast pain    Blood pressure 112/78, pulse 73, height 5' (1.524 m), weight 135 lb (61.2 kg), last menstrual period 05/17/2022.  Physical Exam Physical Exam Vitals and nursing note reviewed. Exam conducted with a chaperone present.  Constitutional:      Appearance: Normal appearance.  Cardiovascular:     Rate and Rhythm: Normal rate.  Pulmonary:     Effort: Pulmonary effort is normal.  Abdominal:     General: Abdomen is flat.  Genitourinary:    General: Normal vulva.     Exam position: Lithotomy position.     Vagina: Normal.     Cervix: Normal.     Uterus: Normal.      Adnexa: Right adnexa normal and left adnexa normal.  Musculoskeletal:     Cervical back: Normal range of motion.  Neurological:     General: No focal deficit present.  Psychiatric:        Mood and Affect: Mood normal.        Behavior: Behavior normal.     Data Reviewed notes  Assessment Encounter for annual routine  gynecological examination - Plan: Cytology - PAP( [place])  High risk sexual behavior, unspecified type - Plan: Cervicovaginal ancillary only( [place]), CANCELED: RPR, CANCELED: Hepatitis B Surface AntiGEN, CANCELED: Hepatitis C Antibody  Routine screening for STI (sexually transmitted infection) - Plan: Cervicovaginal ancillary only( ), CANCELED: RPR, CANCELED: Hepatitis B Surface AntiGEN, CANCELED: Hepatitis C Antibody   Plan Normal exam, desires screen for cx cancer and BV RTC annually    Scheryl Darter 06/06/2022, 9:52 PM

## 2022-06-30 ENCOUNTER — Other Ambulatory Visit: Payer: Self-pay | Admitting: Obstetrics and Gynecology

## 2022-06-30 DIAGNOSIS — B9689 Other specified bacterial agents as the cause of diseases classified elsewhere: Secondary | ICD-10-CM

## 2022-07-01 MED ORDER — METRONIDAZOLE 500 MG PO TABS: 500.0000 mg | ORAL_TABLET | Freq: Two times a day (BID) | ORAL | 0 refills | Status: DC

## 2022-07-23 ENCOUNTER — Other Ambulatory Visit: Payer: Self-pay | Admitting: Obstetrics and Gynecology

## 2022-07-23 DIAGNOSIS — B9689 Other specified bacterial agents as the cause of diseases classified elsewhere: Secondary | ICD-10-CM

## 2022-07-23 MED ORDER — METRONIDAZOLE 500 MG PO TABS: 500.0000 mg | ORAL_TABLET | Freq: Two times a day (BID) | ORAL | 0 refills | Status: DC

## 2022-07-23 NOTE — Telephone Encounter (Signed)
Schedule appt if symptoms not resolved by medication

## 2022-08-12 ENCOUNTER — Ambulatory Visit (HOSPITAL_COMMUNITY)
Admission: EM | Admit: 2022-08-12 | Discharge: 2022-08-12 | Disposition: A | Discharge status: Home / Self Care | Payer: Medicare Other | Attending: Physician Assistant | Admitting: Physician Assistant

## 2022-08-12 ENCOUNTER — Ambulatory Visit (INDEPENDENT_AMBULATORY_CARE_PROVIDER_SITE_OTHER): Payer: Medicare Other

## 2022-08-12 DIAGNOSIS — M546 Pain in thoracic spine: Secondary | ICD-10-CM

## 2022-08-12 DIAGNOSIS — M542 Cervicalgia: Secondary | ICD-10-CM

## 2022-08-12 DIAGNOSIS — S161XXA Strain of muscle, fascia and tendon at neck level, initial encounter: Secondary | ICD-10-CM

## 2022-08-12 MED ORDER — IBUPROFEN 800 MG PO TABS: 800.0000 mg | ORAL_TABLET | Freq: Three times a day (TID) | ORAL | 0 refills | Status: DC

## 2022-08-12 MED ORDER — METHOCARBAMOL 500 MG PO TABS: 500.0000 mg | ORAL_TABLET | Freq: Two times a day (BID) | ORAL | 0 refills | Status: DC
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
## 2022-08-22 ENCOUNTER — Telehealth: Payer: Medicare Other | Admitting: Emergency Medicine

## 2022-08-22 DIAGNOSIS — N760 Acute vaginitis: Secondary | ICD-10-CM

## 2022-08-22 DIAGNOSIS — B9689 Other specified bacterial agents as the cause of diseases classified elsewhere: Secondary | ICD-10-CM | POA: Diagnosis not present

## 2022-08-22 MED ORDER — METRONIDAZOLE 500 MG PO TABS: 500.0000 mg | ORAL_TABLET | Freq: Two times a day (BID) | ORAL | 0 refills | Status: AC

## 2022-08-22 MED ORDER — FLUCONAZOLE 150 MG PO TABS: 150.0000 mg | ORAL_TABLET | Freq: Once | ORAL | 0 refills | Status: AC

## 2022-08-22 NOTE — Progress Notes (Signed)
E-Visit for Vaginal Symptoms  We are sorry that you are not feeling well. Here is how we plan to help! Based on what you shared with me it looks like you: May have a vaginosis due to bacteria  Vaginosis is an inflammation of the vagina that can result in discharge, itching and pain. The cause is usually a change in the normal balance of vaginal bacteria or an infection. Vaginosis can also result from reduced estrogen levels after menopause.  I STRONGLY recommend you stop douching. And when washing your genital area, you can use soap on any place that grows hair but do not wash with soap any place where hair doesn't typically grow. A woman's vagina naturally has bacteria in it (just like we have on our skin and in our noses, in our intestines, etc) and this is normal and healthy. But if you kill off all of the "good" natural bacteria that belong there, it gives the "bad" bacteria a chance to take over and this is what causes the bad odor and vaginal infection.   The most common causes of vaginosis are:   Bacterial vaginosis which results from an overgrowth of one on several organisms that are normally present in your vagina.   Yeast infections which are caused by a naturally occurring fungus called candida.   Vaginal atrophy (atrophic vaginosis) which results from the thinning of the vagina from reduced estrogen levels after menopause.   Trichomoniasis which is caused by a parasite and is commonly transmitted by sexual intercourse.  Factors that increase your risk of developing vaginosis include: Medications, such as antibiotics and steroids Uncontrolled diabetes Use of hygiene products such as bubble bath, vaginal spray or vaginal deodorant Douching Wearing damp or tight-fitting clothing Using an intrauterine device (IUD) for birth control Hormonal changes, such as those associated with pregnancy, birth control pills or menopause Sexual activity Having a sexually transmitted  infection  Your treatment plan is Metronidazole or Flagyl 500mg  twice a day for 7 days.  I have electronically sent this prescription into the pharmacy that you have chosen.  I have also prescribed diflucan for you to take if you develop yeast infection symptoms after taking the antibiotics for BV.   Be sure to take all of the medication as directed. Stop taking any medication if you develop a rash, tongue swelling or shortness of breath. Mothers who are breast feeding should consider pumping and discarding their breast milk while on these antibiotics. However, there is no consensus that infant exposure at these doses would be harmful.  Remember that medication creams can weaken latex condoms.   HOME CARE:  Good hygiene may prevent some types of vaginosis from recurring and may relieve some symptoms:  Avoid baths, hot tubs and whirlpool spas. Rinse soap from your outer genital area after a shower, and dry the area well to prevent irritation. Don't use scented or harsh soaps, such as those with deodorant or antibacterial action. Avoid irritants. These include scented tampons and pads. Wipe from front to back after using the toilet. Doing so avoids spreading fecal bacteria to your vagina.  Other things that may help prevent vaginosis include:  Don't douche. Your vagina doesn't require cleansing other than normal bathing. Repetitive douching disrupts the normal organisms that reside in the vagina and can actually increase your risk of vaginal infection. Douching won't clear up a vaginal infection. Use a latex condom. Both female and female latex condoms may help you avoid infections spread by sexual contact. Wear  cotton underwear. Also wear pantyhose with a cotton crotch. If you feel comfortable without it, skip wearing underwear to bed. Yeast thrives in [employer] Your symptoms should improve in the next day or two.  GET HELP RIGHT AWAY IF:  You have pain in your lower abdomen (  pelvic area or over your ovaries) You develop nausea or vomiting You develop a fever Your discharge changes or worsens You have persistent pain with intercourse You develop shortness of breath, a rapid pulse, or you faint.  These symptoms could be signs of problems or infections that need to be evaluated by a medical provider now.  MAKE SURE YOU   Understand these instructions. Will watch your condition. Will get help right away if you are not doing well or get worse.  Thank you for choosing an e-visit.  Your e-visit answers were reviewed by a board certified advanced clinical practitioner to complete your personal care plan. Depending upon the condition, your plan could have included both over the counter or prescription medications.  Please review your pharmacy choice. Make sure the pharmacy is open so you can pick up prescription now. If there is a problem, you may contact your provider through [employer] and have the prescription routed to another pharmacy.  Your safety is important to [place]. If you have drug allergies check your prescription carefully.   For the next 24 hours you can use MyChart to ask questions about today's visit, request a non-urgent call back, or ask for a work or school excuse. You will get an email in the next two days asking about your experience. I hope that your e-visit has been valuable and will speed your recovery.  I have spent 5 minutes in review of e-visit questionnaire, review and updating patient chart, medical decision making and response to patient.   Willeen Cass, PhD, FNP-BC

## 2022-10-10 ENCOUNTER — Encounter (HOSPITAL_COMMUNITY): Payer: Self-pay | Admitting: Emergency Medicine

## 2022-10-10 ENCOUNTER — Other Ambulatory Visit: Payer: Self-pay

## 2022-10-10 ENCOUNTER — Ambulatory Visit (HOSPITAL_COMMUNITY)
Admission: EM | Admit: 2022-10-10 | Discharge: 2022-10-10 | Disposition: A | Discharge status: Home / Self Care | Payer: Medicare Other | Attending: Family Medicine | Admitting: Family Medicine

## 2022-10-10 DIAGNOSIS — M542 Cervicalgia: Secondary | ICD-10-CM

## 2022-10-10 DIAGNOSIS — F411 Generalized anxiety disorder: Secondary | ICD-10-CM

## 2022-10-10 DIAGNOSIS — M549 Dorsalgia, unspecified: Secondary | ICD-10-CM | POA: Diagnosis not present

## 2022-10-10 MED ORDER — KETOROLAC TROMETHAMINE 30 MG/ML IJ SOLN
INTRAMUSCULAR | Status: AC
  Filled 2022-10-10: qty 1

## 2022-10-10 MED ORDER — NAPROXEN 500 MG PO TABS: 500.0000 mg | ORAL_TABLET | Freq: Two times a day (BID) | ORAL | 0 refills | Status: DC | PRN

## 2022-10-10 MED ORDER — HYDROXYZINE HCL 25 MG PO TABS: 12.5000 mg | ORAL_TABLET | Freq: Three times a day (TID) | ORAL | 0 refills | Status: DC | PRN

## 2022-10-10 MED ORDER — TIZANIDINE HCL 4 MG PO TABS: 4.0000 mg | ORAL_TABLET | Freq: Three times a day (TID) | ORAL | 0 refills | Status: AC | PRN

## 2022-10-10 MED ORDER — KETOROLAC TROMETHAMINE 30 MG/ML IJ SOLN
30.0000 mg | Freq: Once | INTRAMUSCULAR | Status: AC
  Administered 2022-10-10: 30 mg via INTRAMUSCULAR

## 2022-10-10 MED ORDER — KETOROLAC TROMETHAMINE 60 MG/2ML IM SOLN
INTRAMUSCULAR | Status: AC
  Filled 2022-10-10: qty 2
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
## 2022-10-10 NOTE — Discharge Instructions (Signed)
You have been given a shot of Toradol 30 mg today.  Take naproxen 500 mg--1 tablet every 12 hours as needed for pain   Take tizanidine 4 mg--1 every 8 hours as needed for muscle spasms; this medication can cause dizziness and sleepiness  Hydroxyzine 25 mg tablet--try 1/2 to 1 tablet every 8 hours as needed for anxiety.

## 2022-10-16 ENCOUNTER — Other Ambulatory Visit: Payer: Self-pay

## 2022-10-16 ENCOUNTER — Encounter (HOSPITAL_COMMUNITY): Payer: Self-pay | Admitting: Emergency Medicine

## 2022-10-16 ENCOUNTER — Emergency Department (HOSPITAL_COMMUNITY)
Admission: EM | Admit: 2022-10-16 | Discharge: 2022-10-16 | Disposition: A | Discharge status: Home / Self Care | Payer: Medicare Other | Attending: Emergency Medicine | Admitting: Emergency Medicine

## 2022-10-16 ENCOUNTER — Emergency Department (HOSPITAL_COMMUNITY): Payer: Medicare Other

## 2022-10-16 DIAGNOSIS — R053 Chronic cough: Secondary | ICD-10-CM

## 2022-10-16 DIAGNOSIS — H1032 Unspecified acute conjunctivitis, left eye: Secondary | ICD-10-CM | POA: Diagnosis not present

## 2022-10-16 DIAGNOSIS — N760 Acute vaginitis: Secondary | ICD-10-CM | POA: Insufficient documentation

## 2022-10-16 DIAGNOSIS — B9689 Other specified bacterial agents as the cause of diseases classified elsewhere: Secondary | ICD-10-CM | POA: Diagnosis not present

## 2022-10-16 DIAGNOSIS — H109 Unspecified conjunctivitis: Secondary | ICD-10-CM

## 2022-10-16 DIAGNOSIS — N390 Urinary tract infection, site not specified: Secondary | ICD-10-CM | POA: Diagnosis not present

## 2022-10-16 DIAGNOSIS — N898 Other specified noninflammatory disorders of vagina: Secondary | ICD-10-CM | POA: Diagnosis present

## 2022-10-16 LAB — URINALYSIS, ROUTINE W REFLEX MICROSCOPIC
Bilirubin Urine: NEGATIVE
Glucose, UA: NEGATIVE mg/dL
Hgb urine dipstick: NEGATIVE
Ketones, ur: 5 mg/dL — AB
Leukocytes,Ua: NEGATIVE
Nitrite: NEGATIVE
Protein, ur: NEGATIVE mg/dL
Specific Gravity, Urine: 1.026 (ref 1.005–1.030)
pH: 5 (ref 5.0–8.0)

## 2022-10-16 LAB — WET PREP, GENITAL
Sperm: NONE SEEN
Trich, Wet Prep: NONE SEEN
WBC, Wet Prep HPF POC: <10 (ref <10)
Yeast Wet Prep HPF POC: NONE SEEN

## 2022-10-16 LAB — GC/CHLAMYDIA PROBE AMP (~~LOC~~) NOT AT ARMC
Chlamydia: NEGATIVE
Comment: NEGATIVE
Comment: NORMAL
Neisseria Gonorrhea: NEGATIVE

## 2022-10-16 LAB — PREGNANCY, URINE: Preg Test, Ur: NEGATIVE

## 2022-10-16 MED ORDER — FLUORESCEIN SODIUM 1 MG OP STRP
1.0000 | ORAL_STRIP | Freq: Once | OPHTHALMIC | Status: AC
  Administered 2022-10-16: 1 via OPHTHALMIC
  Filled 2022-10-16: qty 1

## 2022-10-16 MED ORDER — METHYLPREDNISOLONE 4 MG PO TBPK: ORAL_TABLET | ORAL | 0 refills | Status: AC

## 2022-10-16 MED ORDER — METRONIDAZOLE 500 MG PO TABS: 500.0000 mg | ORAL_TABLET | Freq: Two times a day (BID) | ORAL | 0 refills | Status: DC

## 2022-10-16 MED ORDER — METRONIDAZOLE 500 MG PO TABS: 500.0000 mg | ORAL_TABLET | Freq: Two times a day (BID) | ORAL | 2 refills | Status: DC

## 2022-10-16 MED ORDER — TETRACAINE HCL 0.5 % OP SOLN
2.0000 [drp] | Freq: Once | OPHTHALMIC | Status: AC
  Administered 2022-10-16: 2 [drp] via OPHTHALMIC
  Filled 2022-10-16: qty 4

## 2022-10-16 MED ORDER — ERYTHROMYCIN 5 MG/GM OP OINT: TOPICAL_OINTMENT | Freq: Four times a day (QID) | OPHTHALMIC | 0 refills | Status: AC

## 2022-10-16 NOTE — ED Notes (Signed)
Patient in the room

## 2022-10-16 NOTE — Discharge Instructions (Signed)
Please fill your prescriptions and take these medications as prescribed for management of your symptoms. Your gonorrhea and chlamydia swabs are pending at your discharge today.  You will need to monitor your MyChart for these results and return for treatment if positive.  Return if development of any new or worsening symptoms.

## 2022-10-16 NOTE — ED Triage Notes (Signed)
Patient reports right red , itchy and teary eye for several days , denies injury , she adds dysuria with vaginal discharge.
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
## 2022-10-23 ENCOUNTER — Telehealth: Payer: Self-pay

## 2022-10-23 NOTE — Telephone Encounter (Signed)
     Patient  visit on 1/31  at [facility]  Have you been able to follow up with your primary care physician? No    The patient was or was not able to obtain any needed medicine or equipment. Yes   Are there diet recommendations that you are having difficulty following? Na   Patient expresses understanding of discharge instructions and education provided has no other needs at this time.  Yes     [employer] [phone] [address], [place], [place] [address] Phone: [phone] Email: [email or web address]

## 2022-12-03 ENCOUNTER — Ambulatory Visit (HOSPITAL_BASED_OUTPATIENT_CLINIC_OR_DEPARTMENT_OTHER): Payer: Medicare Other | Admitting: Psychiatry

## 2022-12-03 ENCOUNTER — Encounter (HOSPITAL_COMMUNITY): Payer: Self-pay | Admitting: Psychiatry

## 2022-12-03 VITALS — Wt 135.0 lb

## 2022-12-03 DIAGNOSIS — F1210 Cannabis abuse, uncomplicated: Secondary | ICD-10-CM | POA: Diagnosis not present

## 2022-12-03 DIAGNOSIS — F4310 Post-traumatic stress disorder, unspecified: Secondary | ICD-10-CM | POA: Diagnosis not present

## 2022-12-03 DIAGNOSIS — F1010 Alcohol abuse, uncomplicated: Secondary | ICD-10-CM | POA: Diagnosis not present

## 2022-12-03 DIAGNOSIS — R4183 Borderline intellectual functioning: Secondary | ICD-10-CM

## 2022-12-03 DIAGNOSIS — F319 Bipolar disorder, unspecified: Secondary | ICD-10-CM

## 2022-12-03 MED ORDER — LAMOTRIGINE 25 MG PO TABS: ORAL_TABLET | ORAL | 0 refills | Status: AC

## 2022-12-03 MED ORDER — HYDROXYZINE HCL 25 MG PO TABS: 12.5000 mg | ORAL_TABLET | Freq: Three times a day (TID) | ORAL | 0 refills | Status: AC | PRN
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
## 2022-12-12 ENCOUNTER — Telehealth: Payer: Self-pay

## 2022-12-12 NOTE — Telephone Encounter (Signed)
Contacted [place] T Bannan to schedule their annual wellness visit. Appointment made for 12/17/22.  Norton Blizzard, [place] (AAMA)  [facility] [phone]

## 2022-12-17 ENCOUNTER — Telehealth: Payer: Self-pay

## 2022-12-17 NOTE — Telephone Encounter (Signed)
Called next of kin and no answer.  Prashant Glosser N. Kery Batzel, LPN. [place] Team Direct Dial: [phone]

## 2022-12-17 NOTE — Telephone Encounter (Signed)
Called patient x 3 mailbox full;  Patient may reschedule for the next available appointment.  Rishan Oyama N. Angelos Wasco, LPN. [facility] Team Direct Dial: [phone]

## 2022-12-30 ENCOUNTER — Telehealth (HOSPITAL_COMMUNITY): Payer: Medicare Other | Admitting: Psychiatry

## 2023-01-11 ENCOUNTER — Encounter (HOSPITAL_COMMUNITY): Payer: Self-pay | Admitting: Emergency Medicine

## 2023-01-11 ENCOUNTER — Emergency Department (HOSPITAL_COMMUNITY)
Admission: EM | Admit: 2023-01-11 | Discharge: 2023-01-11 | Disposition: A | Discharge status: Home / Self Care | Payer: Medicare Other | Attending: Emergency Medicine | Admitting: Emergency Medicine

## 2023-01-11 DIAGNOSIS — R102 Pelvic and perineal pain: Secondary | ICD-10-CM | POA: Diagnosis not present

## 2023-01-11 DIAGNOSIS — K1370 Unspecified lesions of oral mucosa: Secondary | ICD-10-CM | POA: Insufficient documentation

## 2023-01-11 DIAGNOSIS — Z113 Encounter for screening for infections with a predominantly sexual mode of transmission: Secondary | ICD-10-CM

## 2023-01-11 DIAGNOSIS — N898 Other specified noninflammatory disorders of vagina: Secondary | ICD-10-CM | POA: Diagnosis not present

## 2023-01-11 LAB — RAPID HIV SCREEN (HIV 1/2 AB+AG)
HIV 1/2 Antibodies: NONREACTIVE
HIV-1 P24 Antigen - HIV24: NONREACTIVE

## 2023-01-11 LAB — URINALYSIS, ROUTINE W REFLEX MICROSCOPIC
Bilirubin Urine: NEGATIVE
Glucose, UA: NEGATIVE mg/dL
Hgb urine dipstick: NEGATIVE
Ketones, ur: NEGATIVE mg/dL
Leukocytes,Ua: NEGATIVE
Nitrite: NEGATIVE
Protein, ur: NEGATIVE mg/dL
Specific Gravity, Urine: 1.013 (ref 1.005–1.030)
pH: 6 (ref 5.0–8.0)

## 2023-01-11 LAB — WET PREP, GENITAL
Clue Cells Wet Prep HPF POC: NONE SEEN
Sperm: NONE SEEN
Trich, Wet Prep: NONE SEEN
WBC, Wet Prep HPF POC: <10 (ref <10)
Yeast Wet Prep HPF POC: NONE SEEN

## 2023-01-11 LAB — PREGNANCY, URINE: Preg Test, Ur: NEGATIVE

## 2023-01-11 MED ORDER — DOXYCYCLINE MONOHYDRATE 100 MG PO TABS: 100.0000 mg | ORAL_TABLET | Freq: Two times a day (BID) | ORAL | 0 refills | Status: AC

## 2023-01-11 MED ORDER — FLUCONAZOLE 150 MG PO TABS: 150.0000 mg | ORAL_TABLET | Freq: Once | ORAL | 0 refills | Status: AC

## 2023-01-11 MED ORDER — CEFTRIAXONE SODIUM 500 MG IJ SOLR
500.0000 mg | Freq: Once | INTRAMUSCULAR | Status: AC
  Administered 2023-01-11: 500 mg via INTRAMUSCULAR
  Filled 2023-01-11: qty 500

## 2023-01-11 MED ORDER — DOXYCYCLINE HYCLATE 100 MG PO TABS
100.0000 mg | ORAL_TABLET | Freq: Once | ORAL | Status: AC
  Administered 2023-01-11: 100 mg via ORAL
  Filled 2023-01-11: qty 1

## 2023-01-11 NOTE — ED Triage Notes (Signed)
Pt having vaginal intercourse and had sudden pain in bottom wall. Hurts to sit and bend, feels like constipation. States "can't poop or fart". No swelling or discharge but fishy odor.

## 2023-01-11 NOTE — ED Provider Notes (Signed)
[place] EMERGENCY DEPARTMENT AT [facility] Provider Note  CSN: [id number] Arrival date & time: 01/11/23  1840  History  Chief Complaint  Patient presents with   Vaginal Pain   Carol Potter is a 37 y.o. female.  Carol Potter is a 37 year old woman who presents today for vaginal pain and STI screening.  She reports that she was having vaginal intercourse earlier today but experienced dyspareunia and the sensation of "something falling out of me".  She would like to be screened for STI without exam.  Also has some dysuria symptoms, vaginal odor, as well as postnasal drip and cough for the last couple of days.  Denies fevers, chills, nausea, vomiting, abdominal pain, suprapubic pain.   Home Medications Prior to Admission medications   Medication Sig Start Date End Date Taking? Authorizing Provider  doxycycline (ADOXA) 100 MG tablet Take 1 tablet (100 mg total) by mouth 2 (two) times daily for 13 doses. 01/11/23 01/18/23 Yes Fayette Pho, MD  EPINEPHrine 0.3 mg/0.3 mL IJ SOAJ injection Inject 0.3 mg into the muscle as needed for anaphylaxis. 03/25/21  Yes [place], Eric J, DO  fluconazole (DIFLUCAN) 150 MG tablet Take 1 tablet (150 mg total) by mouth once for 1 dose. Repeat in 3 days if yeast infection still there. 01/11/23 01/11/23 Yes Fayette Pho, MD  hydrOXYzine (ATARAX) 25 MG tablet Take 0.5-1 tablets (12.5-25 mg total) by mouth every 8 (eight) hours as needed for anxiety. Patient not taking: Reported on 01/11/2023 12/03/22   Cleotis Nipper, MD  lamoTRIgine (LAMICTAL) 25 MG tablet Take one tab daily for one week and than two tab daily Patient not taking: Reported on 01/11/2023 12/03/22   Arfeen, Phillips Grout, MD  methylPREDNISolone (MEDROL DOSEPAK) 4 MG TBPK tablet Take as directed on package Patient not taking: Reported on 01/11/2023 10/16/22   Smoot, Shawn Route, PA-C  metroNIDAZOLE (FLAGYL) 500 MG tablet Take 1 tablet (500 mg total) by mouth 2 (two) times daily. Patient not taking:  Reported on 01/11/2023 10/16/22   Smoot, Shawn Route, PA-C  naproxen (NAPROSYN) 500 MG tablet Take 1 tablet (500 mg total) by mouth 2 (two) times daily as needed (pain). Patient not taking: Reported on 01/11/2023 10/10/22   Zenia Resides, MD  tiZANidine (ZANAFLEX) 4 MG tablet Take 1 tablet (4 mg total) by mouth every 8 (eight) hours as needed for muscle spasms. Patient not taking: Reported on 01/11/2023 10/10/22   Zenia Resides, MD     Allergies    Penicillins, Bee venom, and Other    Review of Systems   Review of Systems  Constitutional:  Negative for activity change, appetite change, fatigue and fever.  HENT:  Positive for congestion, postnasal drip, rhinorrhea and sore throat.   Respiratory:  Positive for cough. Negative for chest tightness and shortness of breath.   Genitourinary:  Positive for dyspareunia. Negative for difficulty urinating, frequency, genital sores, pelvic pain, urgency, vaginal bleeding and vaginal discharge.   Physical Exam Updated Vital Signs BP 113/81   Pulse 60   Temp 98.4 F (36.9 C) (Oral)   Resp 18   LMP 12/21/2022   SpO2 100%  Physical Exam Vitals and nursing note reviewed. Exam conducted with a chaperone present.  Constitutional:      General: She is not in acute distress.    Appearance: Normal appearance. She is not ill-appearing, toxic-appearing or diaphoretic.  HENT:     Head: Normocephalic.     Mouth/Throat:     Mouth: Mucous  membranes are moist.     Comments: Hyperpigmented oral lesion of left buccal surface without erythema, swelling or drainage Eyes:     General: No scleral icterus.    Conjunctiva/sclera: Conjunctivae normal.     Pupils: Pupils are equal, round, and reactive to light.  Genitourinary:    General: Normal vulva.     Exam position: Knee-chest position.     Labia:        Right: No rash, tenderness or lesion.        Left: No rash, tenderness or lesion.      Urethra: No urethral pain or urethral lesion.     Vagina: Vaginal  discharge present.     Cervix: Discharge, lesion and erythema present.     Uterus: With uterine prolapse.      Rectum: Normal.  Skin:    Capillary Refill: Capillary refill takes less than 2 seconds.  Neurological:     General: No focal deficit present.     Mental Status: She is alert and oriented to person, place, and time.  Psychiatric:        Mood and Affect: Mood normal.        Behavior: Behavior normal.    ED Results / Procedures / Treatments   Labs (all labs ordered are listed, but only abnormal results are displayed) Labs Reviewed  WET PREP, GENITAL  RAPID HIV SCREEN (HIV 1/2 AB+AG)  URINALYSIS, ROUTINE W REFLEX MICROSCOPIC  RPR  PREGNANCY, URINE  POC URINE PREG, ED  CERVICOVAGINAL ANCILLARY ONLY  CERVICOVAGINAL ANCILLARY ONLY  CERVICOVAGINAL ANCILLARY ONLY   EKG None  Radiology No results found.  Procedures Procedures   Medications Ordered in ED Medications  cefTRIAXone (ROCEPHIN) injection 500 mg (500 mg Intramuscular Given 01/11/23 2241)  doxycycline (VIBRA-TABS) tablet 100 mg (100 mg Oral Given 01/11/23 2241)   ED Course/ Medical Decision Making/ A&P Clinical Course as of 01/11/23 2304  Sat Jan 11, 2023  2116 Stable 36 YOF with vaginal pain Pelvic and STI rule out  Possible empiric treatment [CC]  2256 U preg [CC]    Clinical Course User Index [CC] Glyn Ade, MD   Medical Decision Making 37 year old woman here with pelvic pain and vaginal discharge.  Urine pregnancy test negative, UA negative for infection.  Aptima swabs collected for gonorrhea and chlamydia screening of oropharynx, vagina, and rectum.  Vaginal exam shows cervical lesion and cervical drainage without CMT, along with first-degree uterine prolapse into the vaginal canal.  Patient treated empirically with IM ceftriaxone and p.o. Doxy, doxycycline prescription sent to pharmacy to finish 7-day course.  Recommend OBGYN follow up for uterine prolapse.   Amount and/or Complexity of Data  Reviewed Labs: ordered.  Risk Prescription drug management.   Final Clinical Impression(s) / ED Diagnoses Final diagnoses:  Vaginal pain  Routine screening for STI (sexually transmitted infection)   Rx / DC Orders ED Discharge Orders          Ordered    doxycycline (ADOXA) 100 MG tablet  2 times daily        01/11/23 2231    fluconazole (DIFLUCAN) 150 MG tablet   Once        01/11/23 2231           Fayette Pho, MD   Fayette Pho, MD 01/11/23 1610    Glyn Ade, MD 01/11/23 2313

## 2023-01-11 NOTE — Discharge Instructions (Addendum)
Today you were evaluated in the emergency room for a vaginal complaint. We collected swabs to screen for STI. These should come back in 2-3 days. Please see your MyChart for the results.   Your urine was negative for infection.  Your vaginal wet prep was negative for BV, yeast, or trichomonas.  Your rapid blood HIV test was negative.  We also treated you presumptively for gonorrhea and chlamydia with a shot of ceftriaxone and oral doxycycline. Please pick up the doxycycline from your pharmacy and start taking it tomorrow.   On exam, there was a bit of uterine prolapse. This likely explains the feeling of "something falling out" of the vaginal canal.  This happens in the ligaments supporting the uterus stretch out do not do a good job of holding up the organs of the pelvis.  See handout for more. Please see an OB/GYN for further management of this.  There are many options for treatment.   Sexually transmitted infections (STI) are infections of the reproductive system and sex organs. There are many types of STIs, but the most common ones are: Chlamydia, Gonorrhea, Genital Herpes, Genital Warts, Syphilis, Hepatitis B and HIV.  Sexually transmitted infections are spread through anal, vaginal or oral sex with an infected man or woman. Anyone who has any kind of unprotected sex (sex without a condom) is at risk. If a mother is infected, she can also pass an STI to her child while she is pregnant or during birth. Some STIs can also be spread through the exchange of blood products and by sharing needles.  STIs are preventable. The best way to prevent all STIs is by not having any kind of sex (oral, anal or vaginal). If you do have sex, use a latex condom every time for anal, vaginal and oral sex. You can also lower you risk by not using drugs or alcohol and by not sharing needles (this includes sharing needles for drugs, tattooing, piercing or steroid use). Other tips for lowing your risk include:  Limiting  your number of sex partners. One lifetime partner is best. Knowing your partners sexual history. Getting tested on a regular basis. Telling your partner if you have an STI. STI's often have no signs or symptoms. In fact you can have an STI, not show signs and still spread it to someone else. You could get an STI from someone who doesn't show any signs. Some signs and symptoms include:  Itching and/or burning in the genital area when you urinate. Sores or blisters that appear on the genital area. A discharge or drip from the genitals that may have an odor. Call your health care provider or the [facility] of [facility] if you think you may have a STI. To make an appointment in either the [facility] or [facility] clinic locations, please call [phone]. The cost is free at both sites and all services are confidential. If you have questions about the test, call [phone].

## 2023-01-12 LAB — RPR: RPR Ser Ql: NONREACTIVE

## 2023-01-13 LAB — CERVICOVAGINAL ANCILLARY ONLY
Chlamydia: NEGATIVE
Comment: NEGATIVE
Comment: NEGATIVE
Comment: NEGATIVE
Comment: NEGATIVE
Comment: NORMAL
Comment: NORMAL
Neisseria Gonorrhea: NEGATIVE
Trichomonas: NEGATIVE

## 2023-01-14 ENCOUNTER — Other Ambulatory Visit (HOSPITAL_COMMUNITY): Payer: Self-pay | Admitting: Psychiatry

## 2023-01-14 DIAGNOSIS — F1010 Alcohol abuse, uncomplicated: Secondary | ICD-10-CM

## 2023-01-14 DIAGNOSIS — F1210 Cannabis abuse, uncomplicated: Secondary | ICD-10-CM

## 2023-01-14 DIAGNOSIS — F4310 Post-traumatic stress disorder, unspecified: Secondary | ICD-10-CM

## 2023-01-14 DIAGNOSIS — F319 Bipolar disorder, unspecified: Secondary | ICD-10-CM

## 2023-01-14 DIAGNOSIS — R4183 Borderline intellectual functioning: Secondary | ICD-10-CM

## 2023-01-14 LAB — CERVICOVAGINAL ANCILLARY ONLY
Chlamydia: NEGATIVE
Comment: NEGATIVE
Comment: NEGATIVE
Comment: NORMAL
Neisseria Gonorrhea: NEGATIVE
Trichomonas: NEGATIVE

## 2023-01-17 ENCOUNTER — Telehealth: Payer: Self-pay

## 2023-01-17 NOTE — Telephone Encounter (Signed)
Transition Care Management Unsuccessful Follow-up Telephone Call  Date of discharge and from where:  [facility]   Attempts:  1st Attempt  Reason for unsuccessful TCM follow-up call:  Unable to reach patient  Patient declined call

## 2023-12-10 ENCOUNTER — Telehealth

## 2023-12-13 ENCOUNTER — Telehealth: Admitting: Physician Assistant

## 2023-12-13 ENCOUNTER — Encounter

## 2023-12-13 DIAGNOSIS — R3989 Other symptoms and signs involving the genitourinary system: Secondary | ICD-10-CM | POA: Diagnosis not present

## 2023-12-13 DIAGNOSIS — B9689 Other specified bacterial agents as the cause of diseases classified elsewhere: Secondary | ICD-10-CM

## 2023-12-13 DIAGNOSIS — N760 Acute vaginitis: Secondary | ICD-10-CM

## 2023-12-13 MED ORDER — CLINDAMYCIN HCL 300 MG PO CAPS: 300.0000 mg | ORAL_CAPSULE | Freq: Two times a day (BID) | ORAL | 0 refills | Status: AC

## 2023-12-13 MED ORDER — SULFAMETHOXAZOLE-TRIMETHOPRIM 800-160 MG PO TABS: 1.0000 | ORAL_TABLET | Freq: Two times a day (BID) | ORAL | 0 refills | Status: AC

## 2023-12-13 NOTE — Patient Instructions (Signed)
 Carol T Holley, thank you for joining Margaretann Loveless, PA-C for today's virtual visit.  While this provider is not your primary care provider (PCP), if your PCP is located in our provider database this encounter information will be shared with them immediately following your visit.   A [place] MyChart account gives you access to today's visit and all your visits, tests, and labs performed at [facility] [place] " click here if you don't have a [place] MyChart account or go to [email or web address]  Consent: (Patient) Carol Potter provided verbal consent for this virtual visit at the beginning of the encounter.  Current Medications:  Current Outpatient Medications:    clindamycin (CLEOCIN) 300 MG capsule, Take 1 capsule (300 mg total) by mouth in the morning and at bedtime., Disp: 14 capsule, Rfl: 0   sulfamethoxazole-trimethoprim (BACTRIM DS) 800-160 MG tablet, Take 1 tablet by mouth 2 (two) times daily., Disp: 10 tablet, Rfl: 0   EPINEPHrine 0.3 mg/0.3 mL IJ SOAJ injection, Inject 0.3 mg into the muscle as needed for anaphylaxis., Disp: 1 each, Rfl: 0   hydrOXYzine (ATARAX) 25 MG tablet, Take 0.5-1 tablets (12.5-25 mg total) by mouth every 8 (eight) hours as needed for anxiety. (Patient not taking: Reported on 01/11/2023), Disp: 30 tablet, Rfl: 0   lamoTRIgine (LAMICTAL) 25 MG tablet, Take one tab daily for one week and than two tab daily (Patient not taking: Reported on 01/11/2023), Disp: 60 tablet, Rfl: 0   methylPREDNISolone (MEDROL DOSEPAK) 4 MG TBPK tablet, Take as directed on package (Patient not taking: Reported on 01/11/2023), Disp: 1 each, Rfl: 0   naproxen (NAPROSYN) 500 MG tablet, Take 1 tablet (500 mg total) by mouth 2 (two) times daily as needed (pain). (Patient not taking: Reported on 01/11/2023), Disp: 30 tablet, Rfl: 0   tiZANidine (ZANAFLEX) 4 MG tablet, Take 1 tablet (4 mg total) by mouth every 8 (eight) hours as needed for muscle spasms. (Patient not  taking: Reported on 01/11/2023), Disp: 15 tablet, Rfl: 0   Medications ordered in this encounter:  Meds ordered this encounter  Medications   sulfamethoxazole-trimethoprim (BACTRIM DS) 800-160 MG tablet    Sig: Take 1 tablet by mouth 2 (two) times daily.    Dispense:  10 tablet    Refill:  0    Supervising Provider:   Merrilee Jansky [[id number]]   clindamycin (CLEOCIN) 300 MG capsule    Sig: Take 1 capsule (300 mg total) by mouth in the morning and at bedtime.    Dispense:  14 capsule    Refill:  0    Supervising Provider:   Merrilee Jansky [[id number]]     *If you need refills on other medications prior to your next appointment, please contact your pharmacy*  Follow-Up: Call back or seek an in-person evaluation if the symptoms worsen or if the condition fails to improve as anticipated.  [facility] [phone]  Other Instructions Vaginal Probiotics: AZO vaginal probiotic OLLY Happy Hoo-Ha RAW Vaginal Care RenewLife Women's vaginal probiotic RepHresh Pro-B  Vaginal washes: Honey Pot Summer's Eve Vagisil Feminine cleanser  Healthy vaginal hygiene practices    -  Avoid sleeper pajamas. Nightgowns allow air to circulate.  Sleep without underpants whenever possible.   -  Wear cotton underpants during the day. Double-rinse underwear after washing to avoid residual irritants. Do not use fabric softeners for underwear and swimsuits.   - Avoid tights, leotards, leggings, "skinny" jeans, and other tight-fitting clothing. Skirts and loose-fitting pants allow  air to circulate.   - Avoid pantyliners.  Instead use tampons or cotton pads.   - Use the restroom after intercourse to help prevent UTI's   - Daily warm bathing is helpful:     - Soak in clean water (no soap) for 10 to 15 minutes. Adding vinegar or baking soda to the water has not been specifically studied and may not be better than clean water alone.      - Use soap to wash regions other than the genital  area just before getting out of the tub. Limit use of any soap on genital areas. Use fragance-free soaps.     - Rinse the genital area well and gently pat dry.  Don't rub.  Hair dryer to assist with drying can be used only if on cool setting.     - Do not use bubble baths or perfumed soaps.   - Do not use any feminine sprays, douches or powders.  These contain chemicals that will irritate the skin.   - If the genital area is tender or swollen, cool compresses may relieve the discomfort. Unscented wet wipes can be used instead of toilet paper for wiping.    - Emollients, such as Vaseline, may help protect skin and can be applied to the irritated area.   - Always remember to wipe front-to-back after bowel movements. Pat dry after urination.   - Do not sit in wet swimsuits for long periods of time after swimming    If you have been instructed to have an in-person evaluation today at a local Urgent Care facility, please use the link below. It will take you to a list of all of our available [facility], including address, phone number and hours of operation. Please do not delay care.  [facility]  If you or a family member do not have a primary care provider, use the link below to schedule a visit and establish care. When you choose a [facility] primary care physician or advanced practice provider, you gain a long-term partner in health. Find a Primary Care Provider  Learn more about [facility]'s in-office and virtual care options: [facility] - Get Care Now

## 2023-12-13 NOTE — Progress Notes (Signed)
 Virtual Visit Consent   Carol T Racca, you are scheduled for a virtual visit with a [facility] provider today. Just as with appointments in the office, your consent must be obtained to participate. Your consent will be active for this visit and any virtual visit you may have with one of our providers in the next 365 days. If you have a MyChart account, a copy of this consent can be sent to you electronically.  As this is a virtual visit, video technology does not allow for your provider to perform a traditional examination. This may limit your provider's ability to fully assess your condition. If your provider identifies any concerns that need to be evaluated in person or the need to arrange testing (such as labs, EKG, etc.), we will make arrangements to do so. Although advances in technology are sophisticated, we cannot ensure that it will always work on either your end or our end. If the connection with a video visit is poor, the visit may have to be switched to a telephone visit. With either a video or telephone visit, we are not always able to ensure that we have a secure connection.  By engaging in this virtual visit, you consent to the provision of healthcare and authorize for your insurance to be billed (if applicable) for the services provided during this visit. Depending on your insurance coverage, you may receive a charge related to this service.  I need to obtain your verbal consent now. Are you willing to proceed with your visit today? Carol T Monday has provided verbal consent on 12/13/2023 for a virtual visit (video or telephone). Margaretann Loveless, PA-C  Date: 12/13/2023 3:17 PM   Virtual Visit via Video Note   I, Margaretann Loveless, connected with  Carol T Way  ([id number], May 26, 1986) on 12/13/23 at  3:30 PM EDT by a video-enabled telemedicine application and verified that I am speaking with the correct person using two identifiers.  Location: Patient: Virtual Visit  Location Patient: Home Provider: Virtual Visit Location Provider: Home Office   I discussed the limitations of evaluation and management by telemedicine and the availability of in person appointments. The patient expressed understanding and agreed to proceed.    History of Present Illness: Carol Potter is a 38 y.o. who identifies as a female who was assigned female at birth, and is being seen today for BV and UTI symptoms.  HPI: HPI  Went to [facility], had testing that was positive for BV and UTI.  Was prescribed Metronidazole and something else for her UTI (something that started with an "M"). Symptoms have not resolved. Patient has history of recurrent vaginitis. Patient requesting refill of medications or different medications.   Problems:  Patient Active Problem List   Diagnosis Date Noted   Menorrhagia with regular cycle 03/21/2022   Adenomyosis of uterus 03/21/2022   Suicidal ideations 03/21/2022   Chronic pain of right knee 03/21/2022   Depression 03/21/2022   Hx of suicide attempt 03/21/2022   Chronic RLQ pain 03/21/2022   Flatulence 03/21/2022    Allergies:  Allergies  Allergen Reactions   Penicillins Itching    Vaginal Yeast infection   Bee Venom    Other Other (See Comments)    "Vaginal Yeast Infection after taking any antibiotic"   Medications:  Current Outpatient Medications:    clindamycin (CLEOCIN) 300 MG capsule, Take 1 capsule (300 mg total) by mouth in the morning and at bedtime., Disp: 14 capsule, Rfl: 0  sulfamethoxazole-trimethoprim (BACTRIM DS) 800-160 MG tablet, Take 1 tablet by mouth 2 (two) times daily., Disp: 10 tablet, Rfl: 0   EPINEPHrine 0.3 mg/0.3 mL IJ SOAJ injection, Inject 0.3 mg into the muscle as needed for anaphylaxis., Disp: 1 each, Rfl: 0   hydrOXYzine (ATARAX) 25 MG tablet, Take 0.5-1 tablets (12.5-25 mg total) by mouth every 8 (eight) hours as needed for anxiety. (Patient not taking: Reported on 01/11/2023), Disp: 30 tablet, Rfl: 0    lamoTRIgine (LAMICTAL) 25 MG tablet, Take one tab daily for one week and than two tab daily (Patient not taking: Reported on 01/11/2023), Disp: 60 tablet, Rfl: 0   methylPREDNISolone (MEDROL DOSEPAK) 4 MG TBPK tablet, Take as directed on package (Patient not taking: Reported on 01/11/2023), Disp: 1 each, Rfl: 0   naproxen (NAPROSYN) 500 MG tablet, Take 1 tablet (500 mg total) by mouth 2 (two) times daily as needed (pain). (Patient not taking: Reported on 01/11/2023), Disp: 30 tablet, Rfl: 0   tiZANidine (ZANAFLEX) 4 MG tablet, Take 1 tablet (4 mg total) by mouth every 8 (eight) hours as needed for muscle spasms. (Patient not taking: Reported on 01/11/2023), Disp: 15 tablet, Rfl: 0  Observations/Objective: Patient is well-developed, well-nourished in no acute distress.  Resting comfortably at home.  Head is normocephalic, atraumatic.  No labored breathing.  Speech is clear and coherent with logical content.  Patient is alert and oriented at baseline.    Assessment and Plan: 1. BV (bacterial vaginosis) (Primary) - clindamycin (CLEOCIN) 300 MG capsule; Take 1 capsule (300 mg total) by mouth in the morning and at bedtime.  Dispense: 14 capsule; Refill: 0  2. Suspected UTI - sulfamethoxazole-trimethoprim (BACTRIM DS) 800-160 MG tablet; Take 1 tablet by mouth 2 (two) times daily.  Dispense: 10 tablet; Refill: 0  - Recurrent symptoms.  - Will treat empirically with Bactrim - May use AZO for bladder spasms - Continue to push fluids.   - Symptoms consistent with recurrent BV, failed Metronidazole - Clindamycin prescribed - Limit bubble baths, scented lotions/soaps/detergents - Limit tight fitting clothing  - Seek in person evaluation for urine culture and repeat testing if symptoms do not improve or if they worsen.    Follow Up Instructions: I discussed the assessment and treatment plan with the patient. The patient was provided an opportunity to ask questions and all were answered. The patient  agreed with the plan and demonstrated an understanding of the instructions.  A copy of instructions were sent to the patient via MyChart unless otherwise noted below.    The patient was advised to call back or seek an in-person evaluation if the symptoms worsen or if the condition fails to improve as anticipated.    Margaretann Loveless, PA-C

## 2024-04-05 ENCOUNTER — Emergency Department (HOSPITAL_COMMUNITY)

## 2024-04-05 ENCOUNTER — Emergency Department (HOSPITAL_COMMUNITY)
Admission: EM | Admit: 2024-04-05 | Discharge: 2024-04-05 | Disposition: A | Discharge status: Home / Self Care | Attending: Emergency Medicine | Admitting: Emergency Medicine

## 2024-04-05 ENCOUNTER — Encounter (HOSPITAL_COMMUNITY): Payer: Self-pay | Admitting: Emergency Medicine

## 2024-04-05 DIAGNOSIS — S61211A Laceration without foreign body of left index finger without damage to nail, initial encounter: Secondary | ICD-10-CM | POA: Insufficient documentation

## 2024-04-05 DIAGNOSIS — W260XXA Contact with knife, initial encounter: Secondary | ICD-10-CM | POA: Insufficient documentation

## 2024-04-05 DIAGNOSIS — Z23 Encounter for immunization: Secondary | ICD-10-CM | POA: Diagnosis not present

## 2024-04-05 MED ORDER — TETANUS-DIPHTH-ACELL PERTUSSIS 5-2.5-18.5 LF-MCG/0.5 IM SUSY
0.5000 mL | PREFILLED_SYRINGE | Freq: Once | INTRAMUSCULAR | Status: AC
  Administered 2024-04-05: 0.5 mL via INTRAMUSCULAR
  Filled 2024-04-05: qty 0.5

## 2024-04-05 MED ORDER — LIDOCAINE HCL 2 % IJ SOLN
10.0000 mL | Freq: Once | INTRAMUSCULAR | Status: DC
  Filled 2024-04-05: qty 20

## 2024-04-05 NOTE — ED Notes (Signed)
 Pt appearing tearful in triage. Stating that she is in pain and can not hold pressure due to increased finger pain.

## 2024-04-05 NOTE — ED Triage Notes (Addendum)
 Pt cut R pointer finger across top of bed. Bleeding controlled but pt refusing to hold pressure. Laceration is non arterial and about half inch across pad. Unsure last tetanus. Pt given pressure dressing but refusing to hold manual pressure.

## 2024-04-05 NOTE — Discharge Instructions (Addendum)
 If you develop new or worsening pain, bleeding, signs or symptoms of infection, or any other new/concerning symptoms the return to the ER.

## 2024-04-05 NOTE — ED Provider Notes (Signed)
  EMERGENCY DEPARTMENT AT [facility] Provider Note   CSN: [id number] Arrival date & time: 04/05/24  1914     Patient presents with: Extremity Laceration   Carol Potter is a 38 y.o. female.   HPI 38 year old female presents with a left index finger laceration.  She was trying to open something with a steak knife and accidentally cut her finger.  Feels numb at the area of laceration.  Unknown last Tdap.  She has had a wrap on it for the last hour or so.  This occurred about an hour prior to arrival.  Prior to Admission medications   Medication Sig Start Date End Date Taking? Authorizing Provider  clindamycin  (CLEOCIN ) 300 MG capsule Take 1 capsule (300 mg total) by mouth in the morning and at bedtime. 12/13/23   Vivienne Delon HERO, PA-C  EPINEPHrine  0.3 mg/0.3 mL IJ SOAJ injection Inject 0.3 mg into the muscle as needed for anaphylaxis. 03/25/21   [place], Camellia PARAS, DO  hydrOXYzine  (ATARAX ) 25 MG tablet Take 0.5-1 tablets (12.5-25 mg total) by mouth every 8 (eight) hours as needed for anxiety. Patient not taking: Reported on 01/11/2023 12/03/22   Curry Leni DASEN, MD  lamoTRIgine  (LAMICTAL ) 25 MG tablet Take one tab daily for one week and than two tab daily Patient not taking: Reported on 01/11/2023 12/03/22   Arfeen, Syed T, MD  methylPREDNISolone  (MEDROL  DOSEPAK) 4 MG TBPK tablet Take as directed on package Patient not taking: Reported on 01/11/2023 10/16/22   Smoot, Lauraine LABOR, PA-C  naproxen  (NAPROSYN ) 500 MG tablet Take 1 tablet (500 mg total) by mouth 2 (two) times daily as needed (pain). Patient not taking: Reported on 01/11/2023 10/10/22   Vonna Sharlet POUR, MD  sulfamethoxazole -trimethoprim  (BACTRIM  DS) 800-160 MG tablet Take 1 tablet by mouth 2 (two) times daily. 12/13/23   Vivienne Delon HERO, PA-C  tiZANidine  (ZANAFLEX ) 4 MG tablet Take 1 tablet (4 mg total) by mouth every 8 (eight) hours as needed for muscle spasms. Patient not taking: Reported on 01/11/2023 10/10/22    Vonna Sharlet POUR, MD    Allergies: Penicillins, Bee venom, and Other    Review of Systems  Skin:  Positive for wound.    Updated Vital Signs BP 131/87   Pulse (!) 52   Temp 98.4 F (36.9 C)   Resp 16   SpO2 100%   Physical Exam Vitals and nursing note reviewed.  Constitutional:      Appearance: She is well-developed.  HENT:     Head: Normocephalic.  Pulmonary:     Effort: Pulmonary effort is normal.  Musculoskeletal:     Left hand: Laceration present.     Comments: On the palmar aspect of the distal left index finger there is a linear laceration that does not involve the nail.  It is currently well-approximated and not bleeding.  Skin:    General: Skin is warm and dry.  Neurological:     Mental Status: She is alert.     (all labs ordered are listed, but only abnormal results are displayed) Labs Reviewed - No data to display  EKG: None  Radiology: DG Finger Index Left Result Date: 04/05/2024 CLINICAL DATA:  Second left finger laceration. EXAM: LEFT INDEX FINGER 2+V COMPARISON:  None Available. FINDINGS: There is no evidence of acute fracture or dislocation. 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 There is no evidence of arthropathy or other focal bone abnormality. Soft tissues are unremarkable. IMPRESSION:  Negative. Electronically Signed   By: Suzen Dials M.D.   On: 04/05/2024 20:21     .Laceration Repair  Date/Time: 04/05/2024 10:46 PM  Performed by: Freddi Hamilton, MD Authorized by: Freddi Hamilton, MD   Consent:    Consent obtained:  Verbal   Consent given by:  Patient Laceration details:    Location:  Finger   Finger location:  L index finger   Length (cm):  1 Pre-procedure details:    Preparation:  Patient was prepped and draped in usual sterile fashion and imaging obtained to evaluate for foreign bodies Exploration:    Hemostasis achieved with:  Direct pressure   Imaging obtained: x-ray     Imaging  outcome: foreign body not noted   Treatment:    Area cleansed with:  Saline Skin repair:    Repair method:  Tissue adhesive Repair type:    Repair type:  Simple Post-procedure details:    Dressing:  Splint for protection   Procedure completion:  Tolerated well, no immediate complications    Medications Ordered in the ED  Tdap (BOOSTRIX ) injection 0.5 mL (0.5 mLs Intramuscular Given 04/05/24 2234)                                    Medical Decision Making Amount and/or Complexity of Data Reviewed Radiology: independent interpretation performed.    Details: No fracture/foreign body  Risk Prescription drug management.   Patient presents with a distal finger laceration.  Does not involve the nail.  At the time I am seeing her it has been wrapped for over an hour and the skin is currently well-approximated.  There is no bleeding.  The blood was gently cleaned and offered patient multiple options.  She initially wanted to do sutures but then declined this and would rather try something such as glue given its already well-approximated.  She has already cleaned it out well so I think this is reasonable.  We did discuss potential risks/benefits.  Dermabond was applied, will place in a finger splint and was discharged.    Final diagnoses:  Laceration of left index finger without foreign body without damage to nail, initial encounter    ED Discharge Orders     None          Freddi Hamilton, MD 04/05/24 2248

## 2024-08-03 ENCOUNTER — Emergency Department (HOSPITAL_COMMUNITY)

## 2024-08-03 ENCOUNTER — Emergency Department (HOSPITAL_COMMUNITY)
Admission: EM | Admit: 2024-08-03 | Discharge: 2024-08-03 | Disposition: A | Discharge status: Home / Self Care | Attending: Emergency Medicine | Admitting: Emergency Medicine

## 2024-08-03 ENCOUNTER — Other Ambulatory Visit: Payer: Self-pay

## 2024-08-03 ENCOUNTER — Encounter (HOSPITAL_COMMUNITY): Payer: Self-pay

## 2024-08-03 DIAGNOSIS — R1030 Lower abdominal pain, unspecified: Secondary | ICD-10-CM | POA: Insufficient documentation

## 2024-08-03 DIAGNOSIS — R109 Unspecified abdominal pain: Secondary | ICD-10-CM | POA: Diagnosis present

## 2024-08-03 LAB — CBC
HCT: 42.8 % (ref 36.0–46.0)
Hemoglobin: 13.9 g/dL (ref 12.0–15.0)
MCH: 27.3 pg (ref 26.0–34.0)
MCHC: 32.5 g/dL (ref 30.0–36.0)
MCV: 83.9 fL (ref 80.0–100.0)
Platelets: 297 K/uL (ref 150–400)
RBC: 5.1 MIL/uL (ref 3.87–5.11)
RDW: 13.5 % (ref 11.5–15.5)
WBC: 8.5 K/uL (ref 4.0–10.5)
nRBC: 0 % (ref 0.0–0.2)

## 2024-08-03 LAB — COMPREHENSIVE METABOLIC PANEL WITH GFR
ALT: 11 U/L (ref 0–44)
AST: 15 U/L (ref 15–41)
Albumin: 4.2 g/dL (ref 3.5–5.0)
Alkaline Phosphatase: 51 U/L (ref 38–126)
Anion gap: 14 (ref 5–15)
BUN: 7 mg/dL (ref 6–20)
CO2: 18 mmol/L — ABNORMAL LOW (ref 22–32)
Calcium: 9.3 mg/dL (ref 8.9–10.3)
Chloride: 105 mmol/L (ref 98–111)
Creatinine, Ser: 0.76 mg/dL (ref 0.44–1.00)
GFR, Estimated: >60 mL/min (ref >60)
Glucose, Bld: 122 mg/dL — ABNORMAL HIGH (ref 70–99)
Potassium: 3.9 mmol/L (ref 3.5–5.1)
Sodium: 137 mmol/L (ref 135–145)
Total Bilirubin: 1.1 mg/dL (ref 0.0–1.2)
Total Protein: 7 g/dL (ref 6.5–8.1)

## 2024-08-03 LAB — POC URINE PREG, ED: Preg Test, Ur: NEGATIVE

## 2024-08-03 LAB — URINALYSIS, ROUTINE W REFLEX MICROSCOPIC
Bilirubin Urine: NEGATIVE
Glucose, UA: NEGATIVE mg/dL
Ketones, ur: NEGATIVE mg/dL
Nitrite: NEGATIVE
Protein, ur: 100 mg/dL — AB
Specific Gravity, Urine: 1.028 (ref 1.005–1.030)
pH: 7 (ref 5.0–8.0)

## 2024-08-03 LAB — LIPASE, BLOOD: Lipase: 27 U/L (ref 11–51)

## 2024-08-03 LAB — HCG, SERUM, QUALITATIVE: Preg, Serum: NEGATIVE

## 2024-08-03 MED ORDER — DIPHENHYDRAMINE HCL 50 MG/ML IJ SOLN
25.0000 mg | Freq: Once | INTRAMUSCULAR | Status: DC
  Filled 2024-08-03: qty 1

## 2024-08-03 MED ORDER — OXYCODONE HCL 5 MG PO TABS
5.0000 mg | ORAL_TABLET | Freq: Once | ORAL | Status: AC
  Administered 2024-08-03: 5 mg via ORAL
  Filled 2024-08-03: qty 1

## 2024-08-03 MED ORDER — LIDOCAINE 5 % EX PTCH
1.0000 | MEDICATED_PATCH | CUTANEOUS | Status: DC
  Administered 2024-08-03: 1 via TRANSDERMAL
  Filled 2024-08-03: qty 1

## 2024-08-03 MED ORDER — LIDOCAINE 5 % EX PTCH: 1.0000 | MEDICATED_PATCH | CUTANEOUS | 0 refills | Status: AC

## 2024-08-03 MED ORDER — PROCHLORPERAZINE EDISYLATE 10 MG/2ML IJ SOLN
10.0000 mg | Freq: Once | INTRAMUSCULAR | Status: AC
  Administered 2024-08-03: 10 mg via INTRAVENOUS
  Filled 2024-08-03: qty 2

## 2024-08-03 MED ORDER — ACETAMINOPHEN 500 MG PO TABS
1000.0000 mg | ORAL_TABLET | Freq: Once | ORAL | Status: AC
  Administered 2024-08-03: 1000 mg via ORAL
  Filled 2024-08-03: qty 2

## 2024-08-03 MED ORDER — KETOROLAC TROMETHAMINE 15 MG/ML IJ SOLN
15.0000 mg | Freq: Once | INTRAMUSCULAR | Status: DC
  Filled 2024-08-03: qty 1

## 2024-08-03 MED ORDER — DIPHENHYDRAMINE HCL 50 MG/ML IJ SOLN
25.0000 mg | Freq: Once | INTRAMUSCULAR | Status: AC
  Administered 2024-08-03: 25 mg via INTRAVENOUS
  Filled 2024-08-03: qty 1

## 2024-08-03 MED ORDER — KETOROLAC TROMETHAMINE 15 MG/ML IJ SOLN
15.0000 mg | Freq: Once | INTRAMUSCULAR | Status: AC
  Administered 2024-08-03: 15 mg via INTRAVENOUS
  Filled 2024-08-03: qty 1

## 2024-08-03 MED ORDER — DIAZEPAM 5 MG PO TABS
5.0000 mg | ORAL_TABLET | Freq: Once | ORAL | Status: AC
  Administered 2024-08-03: 5 mg via ORAL
  Filled 2024-08-03: qty 1

## 2024-08-03 MED ORDER — PROCHLORPERAZINE EDISYLATE 10 MG/2ML IJ SOLN
10.0000 mg | Freq: Once | INTRAMUSCULAR | Status: DC
  Filled 2024-08-03: qty 2

## 2024-08-03 MED ORDER — IOHEXOL 350 MG/ML SOLN
75.0000 mL | Freq: Once | INTRAVENOUS | Status: AC | PRN
  Administered 2024-08-03: 75 mL via INTRAVENOUS

## 2024-08-03 NOTE — ED Provider Triage Note (Signed)
 Emergency Medicine Provider Triage Evaluation Note  Zarinah T Wheat , a 38 y.o. female  was evaluated in triage.  Pt complains of Abdominal pain patient tells me that this is to the right side of her prior C-section incision.  Tells me she has had pain for about 8 years at that site.  Pain seems to come and go.  She tells me that she has been seen multiple times for this and has had imaging studies and told that she has gas and fibroids.  She disagrees with that diagnosis and think she needs an ex lap.  She is hoping that can be performed today.  Improved with pressure over the area.  No fevers no vomiting.  Denies vaginal bleeding or discharge..  Review of Systems  Positive: RLQ pain Negative: N/v/d  Physical Exam  BP (!) 139/92 (BP Location: Right Arm)   Pulse 71   Resp 20   SpO2 99%  Gen:   Awake, no distress   Resp:  Normal effort  MSK:   Moves extremities without difficulty  Other:    Medical Decision Making  Medically screening exam initiated at 8:21 AM.  Appropriate orders placed.  Tasheena T Salameh was informed that the remainder of the evaluation will be completed by another provider, this initial triage assessment does not replace that evaluation, and the importance of remaining in the ED until their evaluation is complete.     Emil Share, DO 08/03/24 [id number]

## 2024-08-03 NOTE — ED Triage Notes (Signed)
 Pt reports her c section scar feels like it is ripping apart. Pt had c section and tubes tied in 2017. Pt is tearful and laying in the floor.

## 2024-08-03 NOTE — ED Provider Notes (Signed)
 [place] EMERGENCY DEPARTMENT AT [facility] Provider Note   CSN: [id number] Arrival date & time: 08/03/24  9280     Patient presents with: Abdominal Pain   Carol Potter is a 38 y.o. female.    Abdominal Pain  Patient is a 38 year old female with past medical history significant for bronchitis, arthritis, menometrorrhagia chronic pain   Patient presents emergency room today for complaints of abdominal pain/upper pelvic pain on right side she states that has been present in this area for 8 or 9 years since she had a C-section and is concerned that the OB/GYN stitched her upper arm.  She states that she has had some persistent pain over the past 4 years.  No fevers at home she denies any urinary urgency urgency dysuria hematuria.  Denies any chest pain difficulty breathing no lightheadedness or dizziness no neck or back pain.  She denies any abnormal vaginal discharge and denies any changes of her bowel movements although she does indicate that sometimes her bowel movements are somewhat hard.      Prior to Admission medications   Medication Sig Start Date End Date Taking? Authorizing Provider  lidocaine  (LIDODERM ) 5 % Place 1 patch onto the skin daily. Remove & Discard patch within 12 hours or as directed by MD 08/03/24  Yes Boaz Berisha, Hamp RAMAN, PA  clindamycin  (CLEOCIN ) 300 MG capsule Take 1 capsule (300 mg total) by mouth in the morning and at bedtime. 12/13/23   Vivienne Delon HERO, PA-C  EPINEPHrine  0.3 mg/0.3 mL IJ SOAJ injection Inject 0.3 mg into the muscle as needed for anaphylaxis. 03/25/21   [place], Camellia PARAS, DO  hydrOXYzine  (ATARAX ) 25 MG tablet Take 0.5-1 tablets (12.5-25 mg total) by mouth every 8 (eight) hours as needed for anxiety. Patient not taking: Reported on 01/11/2023 12/03/22   Curry Leni DASEN, MD  lamoTRIgine  (LAMICTAL ) 25 MG tablet Take one tab daily for one week and than two tab daily Patient not taking: Reported on 01/11/2023 12/03/22   Arfeen, Syed T,  MD  methylPREDNISolone  (MEDROL  DOSEPAK) 4 MG TBPK tablet Take as directed on package Patient not taking: Reported on 01/11/2023 10/16/22   Smoot, Lauraine LABOR, PA-C  naproxen  (NAPROSYN ) 500 MG tablet Take 1 tablet (500 mg total) by mouth 2 (two) times daily as needed (pain). Patient not taking: Reported on 01/11/2023 10/10/22   Vonna Sharlet POUR, MD  sulfamethoxazole -trimethoprim  (BACTRIM  DS) 800-160 MG tablet Take 1 tablet by mouth 2 (two) times daily. 12/13/23   Vivienne Delon HERO, PA-C  tiZANidine  (ZANAFLEX ) 4 MG tablet Take 1 tablet (4 mg total) by mouth every 8 (eight) hours as needed for muscle spasms. Patient not taking: Reported on 01/11/2023 10/10/22   Vonna Sharlet POUR, MD    Allergies: Penicillins, Bee venom, and Other    Review of Systems  Gastrointestinal:  Positive for abdominal pain.    Updated Vital Signs BP (!) 126/93   Pulse (!) 58   Temp 98 F (36.7 C) (Oral)   Resp 14   SpO2 100%   Physical Exam Vitals and nursing note reviewed.  Constitutional:      General: She is not in acute distress. HENT:     Head: Normocephalic and atraumatic.     Nose: Nose normal.  Eyes:     General: No scleral icterus. Cardiovascular:     Rate and Rhythm: Normal rate and regular rhythm.     Pulses: Normal pulses.     Heart sounds: Normal heart sounds.  Pulmonary:  Effort: Pulmonary effort is normal. No respiratory distress.     Breath sounds: No wheezing.  Abdominal:     General: A surgical scar is present.     Palpations: Abdomen is soft.     Tenderness: There is no abdominal tenderness.  Musculoskeletal:     Cervical back: Normal range of motion.     Right lower leg: No edema.     Left lower leg: No edema.  Skin:    General: Skin is warm and dry.     Capillary Refill: Capillary refill takes less than 2 seconds.  Neurological:     Mental Status: She is alert. Mental status is at baseline.  Psychiatric:        Mood and Affect: Mood normal.        Behavior: Behavior  normal.     (all labs ordered are listed, but only abnormal results are displayed) Labs Reviewed  COMPREHENSIVE METABOLIC PANEL WITH GFR - Abnormal; Notable for the following components:      Result Value   CO2 18 (*)    Glucose, Bld 122 (*)    All other components within normal limits  URINALYSIS, ROUTINE W REFLEX MICROSCOPIC - Abnormal; Notable for the following components:   Color, Urine AMBER (*)    APPearance CLOUDY (*)    Hgb urine dipstick MODERATE (*)    Protein, ur 100 (*)    Leukocytes,Ua TRACE (*)    Bacteria, UA MANY (*)    All other components within normal limits  LIPASE, BLOOD  CBC  HCG, SERUM, QUALITATIVE  POC URINE PREG, ED    EKG: None  Radiology: CT L-SPINE NO CHARGE Result Date: 08/03/2024 EXAM: CT OF THE LUMBAR SPINE WITHOUT CONTRAST 08/03/2024 09:49:44 AM TECHNIQUE: CT of the lumbar spine was derived from CT abdomen and pelvis Multiplanar reformatted images are provided for review. Automated exposure control, iterative reconstruction, and/or weight based adjustment of the mA/kV was utilized to reduce the radiation dose to as low as reasonably achievable. COMPARISON: None available. CLINICAL HISTORY: FINDINGS: BONES AND ALIGNMENT: 5 lumbar segments assigned L1-L5. Normal vertebral body heights. No acute fracture or suspicious bone lesion. Normal alignment. 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 SOFT TISSUES: No acute abnormality. IMPRESSION: 1. No acute findings. 2. 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 Electronically signed by: Katheleen Faes MD 08/03/2024 10:20 AM EST RP Workstation: [id number]   CT ABDOMEN PELVIS W CONTRAST Result Date: 08/03/2024 EXAM: CT ABDOMEN AND PELVIS WITH CONTRAST 08/03/2024 09:49:44 AM TECHNIQUE: CT of the abdomen and pelvis was performed with the administration of 75 mL of iohexol  (OMNIPAQUE ) 350 MG/ML injection.  Multiplanar reformatted images are provided for review. Automated exposure control, iterative reconstruction, and/or weight-based adjustment of the mA/kV was utilized to reduce the radiation dose to as low as reasonably achievable. COMPARISON: 06/08/2021 CLINICAL HISTORY: RLQ abdominal pain FINDINGS: LOWER CHEST: No acute abnormality. LIVER: Stable 1 cm probable hepatic cyst in segment 5. GALLBLADDER AND BILE DUCTS: Gallbladder is unremarkable. No biliary ductal dilatation. SPLEEN: No acute abnormality. PANCREAS: No acute abnormality. ADRENAL GLANDS: No acute abnormality. KIDNEYS, URETERS AND BLADDER: No stones in the kidneys or ureters. No hydronephrosis. No perinephric or periureteral stranding. Urinary bladder incompletely distended. GI AND BOWEL: Stomach demonstrates no acute abnormality. There is no bowel obstruction. Normal appendix. PERITONEUM AND RETROPERITONEUM: No ascites. No free air. VASCULATURE: Aorta is normal in caliber. LYMPH NODES: No lymphadenopathy.  REPRODUCTIVE ORGANS: No acute abnormality. BONES AND SOFT TISSUES: No acute osseous abnormality. No focal soft tissue abnormality. bilateral pelvic phleboliths. IMPRESSION: 1. No acute findings in the abdomen or pelvis. Electronically signed by: Dayne Hassell MD 08/03/2024 10:17 AM EST RP Workstation: [id number]     Procedures   Medications Ordered in the ED  lidocaine  (LIDODERM ) 5 % 1 patch (1 patch Transdermal Patch Applied 08/03/24 1339)  acetaminophen  (TYLENOL ) tablet 1,000 mg (1,000 mg Oral Given 08/03/24 0838)  oxyCODONE (Oxy IR/ROXICODONE) immediate release tablet 5 mg (5 mg Oral Given 08/03/24 0838)  diazepam (VALIUM) tablet 5 mg (5 mg Oral Given 08/03/24 0838)  ketorolac  (TORADOL ) 15 MG/ML injection 15 mg (15 mg Intravenous Given 08/03/24 1234)  prochlorperazine (COMPAZINE) injection 10 mg (10 mg Intravenous Given 08/03/24 1235)  diphenhydrAMINE  (BENADRYL ) injection 25 mg (25 mg Intravenous Given 08/03/24 1234)  iohexol   (OMNIPAQUE ) 350 MG/ML injection 75 mL (75 mLs Intravenous Contrast Given 08/03/24 0949)                                    Medical Decision Making Amount and/or Complexity of Data Reviewed Labs: ordered.  Risk Prescription drug management.   This patient presents to the ED for concern of abd pain, this involves a number of treatment options, and is a complaint that carries with it a moderate risk of complications and morbidity. A differential diagnosis was considered for the patient's symptoms which is discussed below:   The causes of generalized abdominal pain include but are not limited to AAA, mesenteric ischemia, appendicitis, diverticulitis, DKA, gastritis, gastroenteritis, AMI, nephrolithiasis, pancreatitis, peritonitis, adrenal insufficiency,lead poisoning, iron toxicity, intestinal ischemia, constipation, UTI,SBO/LBO, splenic rupture, biliary disease, IBD, IBS, PUD, or hepatitis. Ectopic pregnancy, ovarian torsion, PID.    Co morbidities: Discussed in HPI   Brief History:  Patient is a 38 year old female with past medical history significant for bronchitis, arthritis, menometrorrhagia chronic pain   Patient presents emergency room today for complaints of abdominal pain/upper pelvic pain on right side she states that has been present in this area for 8 or 9 years since she had a C-section and is concerned that the OB/GYN stitched her upper arm.  She states that she has had some persistent pain over the past 4 years.  No fevers at home she denies any urinary urgency urgency dysuria hematuria.  Denies any chest pain difficulty breathing no lightheadedness or dizziness no neck or back pain.  She denies any abnormal vaginal discharge and denies any changes of her bowel movements although she does indicate that sometimes her bowel movements are somewhat hard.    EMR reviewed including pt PMHx, past surgical history and past visits to ER.   See HPI for more details   Lab  Tests:   I ordered and independently interpreted labs. Labs notable for CMP CBC unremarkable lipase within normal limits pregnancy test negative.  May have bacteria trace leukocytes present on urinalysis open patient has no urinary frequency urgency dysuria or hematuria.  Imaging Studies:  NAD. I personally reviewed all imaging studies and no acute abnormality found. I agree with radiology interpretation. CT abdomen pelvis with L-spine recon unremarkable.  This was ordered prior to my evaluation with patient.   Cardiac Monitoring:  NA NA   Medicines ordered:  I ordered medication including lidocaine  patch, Tylenol , Valium, Percocet, Toradol , Toradol , Compazine for pain and nausea Reevaluation of the patient after these medicines showed that  the patient improved and nausea resolved I have reviewed the patients home medicines and have made adjustments as needed   Critical Interventions:      Consults/Attending Physician      Reevaluation:  After the interventions noted above I re-evaluated patient and found that they have :resolved   Social Determinants of Health:      Problem List / ED Course:  Patient here with chronic abdominal pain.  I reviewed her labs and CT imaging that was ordered in triage.  No acute abnormal findings here I recommend that she follow-up with OB/GYN.  Return precautions emergency room discussed.  Will discharge home at this time. I wonder if lidocaine  will be helpful in this particular setting.   Dispostion:  After consideration of the diagnostic results and the patients response to treatment, I feel that the patent would benefit from outpatient follow-up.   Final diagnoses:  Lower abdominal pain    ED Discharge Orders          Ordered    lidocaine  (LIDODERM ) 5 %  Every 24 hours        08/03/24 1248               Neldon Inoue [place], [place] 08/03/24 1402    Patsey Lot, MD 08/03/24 1505

## 2024-08-03 NOTE — Discharge Instructions (Addendum)
 Please follow-up please follow-up with OB/GYN.  Your CT scan not show any acute abnormal findings today.  Your lab work is reassuring.

## 2024-08-03 NOTE — ED Notes (Signed)
 Pt. Yanked probe from mouth while trying to obtain temp. Unable to obtain

## 2024-08-03 NOTE — ED Notes (Signed)
Pt given Ginger ale and crackers.

## 2024-09-24 ENCOUNTER — Emergency Department (HOSPITAL_COMMUNITY)
Admission: EM | Admit: 2024-09-24 | Discharge: 2024-09-24 | Disposition: A | Discharge status: Home / Self Care | Attending: Emergency Medicine | Admitting: Emergency Medicine

## 2024-09-24 ENCOUNTER — Other Ambulatory Visit: Payer: Self-pay

## 2024-09-24 ENCOUNTER — Encounter (HOSPITAL_COMMUNITY): Payer: Self-pay | Admitting: Emergency Medicine

## 2024-09-24 DIAGNOSIS — R102 Pelvic and perineal pain unspecified side: Secondary | ICD-10-CM | POA: Diagnosis present

## 2024-09-24 LAB — COMPREHENSIVE METABOLIC PANEL WITH GFR
ALT: 9 U/L (ref 0–44)
AST: 17 U/L (ref 15–41)
Albumin: 4.6 g/dL (ref 3.5–5.0)
Alkaline Phosphatase: 67 U/L (ref 38–126)
Anion gap: 13 (ref 5–15)
BUN: 8 mg/dL (ref 6–20)
CO2: 20 mmol/L — ABNORMAL LOW (ref 22–32)
Calcium: 9.5 mg/dL (ref 8.9–10.3)
Chloride: 105 mmol/L (ref 98–111)
Creatinine, Ser: 0.78 mg/dL (ref 0.44–1.00)
GFR, Estimated: >60 mL/min
Glucose, Bld: 131 mg/dL — ABNORMAL HIGH (ref 70–99)
Potassium: 3.8 mmol/L (ref 3.5–5.1)
Sodium: 138 mmol/L (ref 135–145)
Total Bilirubin: 0.8 mg/dL (ref 0.0–1.2)
Total Protein: 7.5 g/dL (ref 6.5–8.1)

## 2024-09-24 LAB — CBC
HCT: 44 % (ref 36.0–46.0)
Hemoglobin: 14.5 g/dL (ref 12.0–15.0)
MCH: 27.5 pg (ref 26.0–34.0)
MCHC: 33 g/dL (ref 30.0–36.0)
MCV: 83.3 fL (ref 80.0–100.0)
Platelets: 311 K/uL (ref 150–400)
RBC: 5.28 MIL/uL — ABNORMAL HIGH (ref 3.87–5.11)
RDW: 13.2 % (ref 11.5–15.5)
WBC: 8.1 K/uL (ref 4.0–10.5)
nRBC: 0 % (ref 0.0–0.2)

## 2024-09-24 LAB — URINALYSIS, ROUTINE W REFLEX MICROSCOPIC
Glucose, UA: NEGATIVE mg/dL
Ketones, ur: NEGATIVE mg/dL
Leukocytes,Ua: NEGATIVE
Nitrite: NEGATIVE
Protein, ur: 100 mg/dL — AB
Specific Gravity, Urine: >1.03 — ABNORMAL HIGH (ref 1.005–1.030)
pH: 6.5 (ref 5.0–8.0)

## 2024-09-24 LAB — URINALYSIS, MICROSCOPIC (REFLEX)

## 2024-09-24 LAB — LIPASE, BLOOD: Lipase: 28 U/L (ref 11–51)

## 2024-09-24 LAB — HCG, SERUM, QUALITATIVE: Preg, Serum: NEGATIVE

## 2024-09-24 MED ORDER — DICYCLOMINE HCL 20 MG PO TABS: 20.0000 mg | ORAL_TABLET | Freq: Two times a day (BID) | ORAL | 0 refills | Status: AC

## 2024-09-24 MED ORDER — HYDROMORPHONE HCL 1 MG/ML IJ SOLN
1.0000 mg | Freq: Once | INTRAMUSCULAR | Status: AC
  Administered 2024-09-24: 1 mg via INTRAVENOUS
  Filled 2024-09-24: qty 1

## 2024-09-24 MED ORDER — SODIUM CHLORIDE 0.9 % IV BOLUS
1000.0000 mL | Freq: Once | INTRAVENOUS | Status: AC
  Administered 2024-09-24: 1000 mL via INTRAVENOUS

## 2024-09-24 MED ORDER — ONDANSETRON HCL 4 MG/2ML IJ SOLN
4.0000 mg | Freq: Once | INTRAMUSCULAR | Status: AC
  Administered 2024-09-24: 4 mg via INTRAVENOUS
  Filled 2024-09-24: qty 2

## 2024-09-24 MED ORDER — NAPROXEN 500 MG PO TABS: 500.0000 mg | ORAL_TABLET | Freq: Two times a day (BID) | ORAL | 0 refills | Status: AC

## 2024-09-24 NOTE — Discharge Instructions (Signed)
 Your lab is also within normal limits today.  Please call to schedule an appointment with an OB/GYN for further management of your symptoms.

## 2024-09-24 NOTE — ED Triage Notes (Signed)
 Pt reports she has pelvic pain which is causing her to throw up.  She is hyperventilating in triage from the pain.  Pt states she gets this pain every month for the past 8 years since the birth of her last child.  She states it has caused her black out earlier in the evening.

## 2024-09-24 NOTE — ED Provider Notes (Signed)
 " [place] [facility] Provider Note   CSN: [id number] Arrival date & time: 09/24/24  0554     Patient presents with: Pelvic Pain   Carol Potter is a 39 y.o. female.   39 y.o female with a PMH of C-section presents to the ED with chief complaint of pelvic pain for the past 8 years.  Patient tells me that every month for the past 8 years she is come to the ER due to pelvic pain.  She feels a pulling sensation on the right side of her pelvic area.  She has been taking multiple over-the-counter medication without any improvement in symptoms.  She tells me that the pain gets so severe that she passes out.  She also voices the fact that I been coming to the emergency department for 8 years and you guys have not given me a doctor to follow-up with .  She also tells me that she is on her menstrual cycle, began this 2 days ago, has been wearing 2 diapers a day.  No fever, no sick contacts, no blood in her stool.  The history is provided by the patient.  Pelvic Pain This is a recurrent problem. The current episode started more than 1 week ago. Pertinent negatives include no chest pain, no abdominal pain and no shortness of breath.       Prior to Admission medications  Medication Sig Start Date End Date Taking? Authorizing Provider  clindamycin  (CLEOCIN ) 300 MG capsule Take 1 capsule (300 mg total) by mouth in the morning and at bedtime. 12/13/23   Vivienne Delon HERO, PA-C  EPINEPHrine  0.3 mg/0.3 mL IJ SOAJ injection Inject 0.3 mg into the muscle as needed for anaphylaxis. 03/25/21   [place], Camellia PARAS, DO  hydrOXYzine  (ATARAX ) 25 MG tablet Take 0.5-1 tablets (12.5-25 mg total) by mouth every 8 (eight) hours as needed for anxiety. Patient not taking: Reported on 01/11/2023 12/03/22   Curry Leni DASEN, MD  lamoTRIgine  (LAMICTAL ) 25 MG tablet Take one tab daily for one week and than two tab daily Patient not taking: Reported on 01/11/2023 12/03/22   Arfeen, Leni DASEN,  MD  lidocaine  (LIDODERM ) 5 % Place 1 patch onto the skin daily. Remove & Discard patch within 12 hours or as directed by MD 08/03/24   Neldon Hamp RAMAN, PA  methylPREDNISolone  (MEDROL  DOSEPAK) 4 MG TBPK tablet Take as directed on package Patient not taking: Reported on 01/11/2023 10/16/22   Smoot, Lauraine LABOR, PA-C  naproxen  (NAPROSYN ) 500 MG tablet Take 1 tablet (500 mg total) by mouth 2 (two) times daily as needed (pain). Patient not taking: Reported on 01/11/2023 10/10/22   Vonna Sharlet POUR, MD  sulfamethoxazole -trimethoprim  (BACTRIM  DS) 800-160 MG tablet Take 1 tablet by mouth 2 (two) times daily. 12/13/23   Vivienne Delon HERO, PA-C  tiZANidine  (ZANAFLEX ) 4 MG tablet Take 1 tablet (4 mg total) by mouth every 8 (eight) hours as needed for muscle spasms. Patient not taking: Reported on 01/11/2023 10/10/22   Vonna Sharlet POUR, MD    Allergies: Penicillins, Bee venom, and Other    Review of Systems  Constitutional:  Negative for chills and fever.  HENT:  Negative for sore throat.   Respiratory:  Negative for shortness of breath.   Cardiovascular:  Negative for chest pain.  Gastrointestinal:  Positive for nausea and vomiting. Negative for abdominal pain.  Genitourinary:  Positive for pelvic pain.  Musculoskeletal:  Negative for back pain.  All other systems reviewed  and are negative.   Updated Vital Signs BP 119/75   Pulse 95   Temp 98.5 F (36.9 C) (Oral)   Resp 18   Ht 5' (1.524 m)   Wt 61.2 kg   SpO2 100%   BMI 26.35 kg/m   Physical Exam Vitals and nursing note reviewed.  Constitutional:      Appearance: Normal appearance.     Comments: Teary eyed on exam.  HENT:     Head: Normocephalic and atraumatic.     Nose: Nose normal.     Mouth/Throat:     Mouth: Mucous membranes are moist.  Cardiovascular:     Rate and Rhythm: Normal rate.  Pulmonary:     Effort: Pulmonary effort is normal.  Abdominal:     General: Abdomen is flat.     Tenderness: There is no abdominal  tenderness.  Musculoskeletal:     Cervical back: Normal range of motion and neck supple.  Skin:    General: Skin is warm and dry.  Neurological:     Mental Status: She is alert and oriented to person, place, and time.     (all labs ordered are listed, but only abnormal results are displayed) Labs Reviewed  COMPREHENSIVE METABOLIC PANEL WITH GFR - Abnormal; Notable for the following components:      Result Value   CO2 20 (*)    Glucose, Bld 131 (*)    All other components within normal limits  CBC - Abnormal; Notable for the following components:   RBC 5.28 (*)    All other components within normal limits  URINALYSIS, ROUTINE W REFLEX MICROSCOPIC - Abnormal; Notable for the following components:   Specific Gravity, Urine >1.030 (*)    Hgb urine dipstick LARGE (*)    Bilirubin Urine SMALL (*)    Protein, ur 100 (*)    All other components within normal limits  URINALYSIS, MICROSCOPIC (REFLEX) - Abnormal; Notable for the following components:   Bacteria, UA RARE (*)    All other components within normal limits  LIPASE, BLOOD  HCG, SERUM, QUALITATIVE    EKG: EKG Interpretation Date/Time:  Friday September 24 2024 06:42:14 EST Ventricular Rate:  52 PR Interval:  133 QRS Duration:  92 QT Interval:  458 QTC Calculation: 426 R Axis:   66  Text Interpretation: Sinus rhythm Confirmed by Yolande Charleston [phone]) on 09/24/2024 8:29:13 AM  Radiology: No results found.   Procedures   Medications Ordered in the ED  HYDROmorphone  (DILAUDID ) injection 1 mg (1 mg Intravenous Given 09/24/24 0819)  ondansetron  (ZOFRAN ) injection 4 mg (4 mg Intravenous Given 09/24/24 0816)  sodium chloride  0.9 % bolus 1,000 mL (1,000 mLs Intravenous New Bag/Given 09/24/24 0817)    Clinical Course as of 09/24/24 1038  Fri Sep 24, 2024  0653 Hgb urine dipstick(!): LARGE [JS]  0653 Bacteria, UA(!): RARE [JS]    Clinical Course User Index [JS] Yavuz Kirby, PA-C                                 Medical  Decision Making Amount and/or Complexity of Data Reviewed Labs: ordered. Decision-making details documented in ED Course.  Risk Prescription drug management.   This patient presents to the ED for concern of pelvic, this involves a number of treatment options, and is a complaint that carries with it a high risk of complications and morbidity.  The differential diagnosis includes chronic pelvic pain, appendicitis, versus  ovarian torsion.    Co morbidities: Discussed in HPI   Brief History:  See HPI.   EMR reviewed including pt PMHx, past surgical history and past visits to ER.   See HPI for more details   Lab Tests:  I ordered and independently interpreted labs.  The pertinent results include:    I personally reviewed all laboratory work and imaging. Metabolic panel without any acute abnormality specifically kidney function within normal limits and no significant electrolyte abnormalities. CBC without leukocytosis or significant anemia.  Imaging Studies:  CT abdomen and pelvis from August 03, 2024 without any acute findings noted then. CT L-spine was also obtained then which did not show any acute findings.  Medicines ordered:  I ordered medication including Dilaudid  zofran  for symptomatic treatment.  Reevaluation of the patient after these medicines showed that the patient improved I have reviewed the patients home medicines and have made adjustments as needed  Reevaluation:  After the interventions noted above I re-evaluated patient and found that they have :improved  Social Determinants of Health:  The patient's social determinants of health were a factor in the care of this patient  Problem List / ED Course:  Patient presents to the ED with chief complaint of pelvic pain has been ongoing for the past 8 years, reports her pain began when she first had a C-section for the birth of her children, she reports she is come to the emergency department every month once a  month due to this ongoing pain.  Reports that she was told in the past that it was gas, has not had any specialist follow-up.  Evaluation she is teary-eyed on exam, vitals are within normal limits. CMP with no electrolyte derangement, creatinine is within normal limits.  LFTs are unremarkable, there is no guarding or tenderness to palpation along the right upper quadrant to suggest gallbladder etiology.  CBC with no leukocytosis, hemoglobin is within normal limits.  Pregnancy test is negative.  Urinalysis with some large hemoglobin present, she is currently on her menses reports this has been normal.  No other signs of infection. Tensive review of prior records including prior CT abdomen and pelvis obtained in the month of November that did not show any acute findings.  Patient is asking here for further imaging however all imaging in the past visits has been within normal limits.  She did have some resolution in her pain after Dilaudid  and Zofran .  She is tolerating p.o. intake without any episodes of vomiting while in the emergency department. Patient was also given fluids, I did discuss with her importance of following up with specialist as we are unable to further assess her ongoing chronic pelvic pain at this time.  She does seem adamant that she wants to find a new doctor, I given her referral for gynecologist at this time.  Patient is hemodynamically stable for discharge.   Dispostion:  After consideration of the diagnostic results and the patients response to treatment, I feel that the patent would benefit from further follow-up with specialist.    Portions of this note were generated with Dragon dictation software. Dictation errors may occur despite best attempts at proofreading.   Final diagnoses:  Pelvic pain in female    ED Discharge Orders     None          Carol Broad, PA-C 09/24/24 1038    Yolande Lamar BROCKS, MD 09/24/24 1812  "

## 2024-09-24 NOTE — ED Notes (Signed)
 Unable to get EKG at this time due to pt moving in pain. RN aware, will wait for pain medication to attempt EKG

## 2024-10-14 ENCOUNTER — Emergency Department (HOSPITAL_BASED_OUTPATIENT_CLINIC_OR_DEPARTMENT_OTHER): Admitting: Radiology

## 2024-10-14 ENCOUNTER — Encounter (HOSPITAL_BASED_OUTPATIENT_CLINIC_OR_DEPARTMENT_OTHER): Payer: Self-pay

## 2024-10-14 ENCOUNTER — Emergency Department (HOSPITAL_BASED_OUTPATIENT_CLINIC_OR_DEPARTMENT_OTHER)

## 2024-10-14 ENCOUNTER — Other Ambulatory Visit: Payer: Self-pay

## 2024-10-14 ENCOUNTER — Emergency Department (HOSPITAL_BASED_OUTPATIENT_CLINIC_OR_DEPARTMENT_OTHER)
Admission: EM | Admit: 2024-10-14 | Discharge: 2024-10-14 | Disposition: A | Discharge status: Home / Self Care | Payer: Worker's Compensation

## 2024-10-14 DIAGNOSIS — M542 Cervicalgia: Secondary | ICD-10-CM | POA: Diagnosis not present

## 2024-10-14 DIAGNOSIS — Y990 Civilian activity done for income or pay: Secondary | ICD-10-CM | POA: Insufficient documentation

## 2024-10-14 DIAGNOSIS — W000XXA Fall on same level due to ice and snow, initial encounter: Secondary | ICD-10-CM | POA: Insufficient documentation

## 2024-10-14 DIAGNOSIS — M25522 Pain in left elbow: Secondary | ICD-10-CM | POA: Insufficient documentation

## 2024-10-14 DIAGNOSIS — R0781 Pleurodynia: Secondary | ICD-10-CM | POA: Insufficient documentation

## 2024-10-14 DIAGNOSIS — M25512 Pain in left shoulder: Secondary | ICD-10-CM | POA: Diagnosis not present

## 2024-10-14 DIAGNOSIS — W19XXXA Unspecified fall, initial encounter: Secondary | ICD-10-CM

## 2024-10-14 DIAGNOSIS — R0789 Other chest pain: Secondary | ICD-10-CM

## 2024-10-14 MED ORDER — LIDOCAINE 5 % EX PTCH
1.0000 | MEDICATED_PATCH | CUTANEOUS | Status: DC
  Administered 2024-10-14: 1 via TRANSDERMAL
  Filled 2024-10-14: qty 1

## 2024-10-14 MED ORDER — ACETAMINOPHEN 500 MG PO TABS
1000.0000 mg | ORAL_TABLET | Freq: Once | ORAL | Status: AC
  Administered 2024-10-14: 1000 mg via ORAL
  Filled 2024-10-14: qty 2

## 2024-10-14 MED ORDER — LIDOCAINE 5 % EX PTCH: 1.0000 | MEDICATED_PATCH | CUTANEOUS | 0 refills | Status: AC

## 2024-10-14 NOTE — ED Notes (Signed)
 RT at bedside with IS teaching.

## 2024-10-14 NOTE — ED Triage Notes (Signed)
 Pt reports falling on ice while at work yesterday around 2pm. Denies hitting head. (-) LOC. Pt ambulatory to room. Pt reports entire left side of her body hurts (worst in left ribs and left upper arm). Pt also reports pain in right ankle. NAD noted.

## 2024-10-14 NOTE — ED Provider Notes (Signed)
 " [place] [facility] AT [facility] Provider Note   CSN: [id number] Arrival date & time: 10/14/24  9348     Patient presents with: [name]   Carol Potter is a 39 y.o. female.   HPI  Presents  mechanical fall.  Slipped on ice.  Fell onto the left side.  Did not hit her head.  No loss of consciousness.  No headache or vision changes.  No anticoagulation use.  Endorses mostly left-sided pain.  Some left-sided neck pain.  Also endorsing some left shoulder pain.  Left elbow pain.  Left rib pain.  No chest pain or shortness of breath.  Once again, did not lose consciousness and this was mechanical fall.  Otherwise, denies all complaints.  No numbness or tingling aware.  Been able to ambulate.  Has not taken thing for pain.  Previous medical history reviewed : Patient last seen in ED on September 24, 2024 in the setting of pelvic pain.  Normal workup.     Prior to Admission medications  Medication Sig Start Date End Date Taking? Authorizing Provider  lidocaine  (LIDODERM ) 5 % Place 1 patch onto the skin daily. Remove & Discard patch within 12 hours or as directed by MD 10/14/24  Yes Simon Lavonia SAILOR, MD  clindamycin  (CLEOCIN ) 300 MG capsule Take 1 capsule (300 mg total) by mouth in the morning and at bedtime. 12/13/23   Vivienne Delon HERO, PA-C  dicyclomine  (BENTYL ) 20 MG tablet Take 1 tablet (20 mg total) by mouth 2 (two) times daily. 09/24/24 10/01/24  Soto, Johana, PA-C  EPINEPHrine  0.3 mg/0.3 mL IJ SOAJ injection Inject 0.3 mg into the muscle as needed for anaphylaxis. 03/25/21   [place], Camellia PARAS, DO  hydrOXYzine  (ATARAX ) 25 MG tablet Take 0.5-1 tablets (12.5-25 mg total) by mouth every 8 (eight) hours as needed for anxiety. Patient not taking: Reported on 01/11/2023 12/03/22   Curry Leni DASEN, MD  lamoTRIgine  (LAMICTAL ) 25 MG tablet Take one tab daily for one week and than two tab daily Patient not taking: Reported on 01/11/2023 12/03/22   Arfeen, Leni DASEN, MD  lidocaine  (LIDODERM ) 5  % Place 1 patch onto the skin daily. Remove & Discard patch within 12 hours or as directed by MD 08/03/24   Neldon Hamp RAMAN, PA  methylPREDNISolone  (MEDROL  DOSEPAK) 4 MG TBPK tablet Take as directed on package Patient not taking: Reported on 01/11/2023 10/16/22   Smoot, Lauraine LABOR, PA-C  naproxen  (NAPROSYN ) 500 MG tablet Take 1 tablet (500 mg total) by mouth 2 (two) times daily. 09/24/24   Soto, Johana, PA-C  sulfamethoxazole -trimethoprim  (BACTRIM  DS) 800-160 MG tablet Take 1 tablet by mouth 2 (two) times daily. 12/13/23   Vivienne Delon HERO, PA-C  tiZANidine  (ZANAFLEX ) 4 MG tablet Take 1 tablet (4 mg total) by mouth every 8 (eight) hours as needed for muscle spasms. Patient not taking: Reported on 01/11/2023 10/10/22   Vonna Sharlet POUR, MD    Allergies: Penicillins, Bee venom, and Other    Review of Systems  Constitutional:  Negative for chills and fever.  HENT:  Negative for ear pain and sore throat.   Eyes:  Negative for pain and visual disturbance.  Respiratory:  Negative for cough and shortness of breath.   Cardiovascular:  Negative for chest pain and palpitations.  Gastrointestinal:  Negative for abdominal pain and vomiting.  Genitourinary:  Negative for dysuria and hematuria.  Musculoskeletal:  Negative for arthralgias and back pain.  Skin:  Negative for color change and rash.  Neurological:  Negative for seizures and syncope.  All other systems reviewed and are negative.   Updated Vital Signs BP (!) 136/94 (BP Location: Right Arm)   Pulse 77   Temp 98.4 F (36.9 C) (Oral)   Resp 18   Ht 5' (1.524 m)   Wt 57.2 kg   SpO2 99%   BMI 24.61 kg/m   Physical Exam Vitals and nursing note reviewed.  Constitutional:      General: She is not in acute distress.    Appearance: She is well-developed.  HENT:     Head: Normocephalic and atraumatic.  Eyes:     Conjunctiva/sclera: Conjunctivae normal.  Cardiovascular:     Rate and Rhythm: Normal rate and regular rhythm.     Heart  sounds: No murmur heard. Pulmonary:     Effort: Pulmonary effort is normal. No respiratory distress.     Breath sounds: Normal breath sounds.  Abdominal:     Palpations: Abdomen is soft.     Tenderness: There is no abdominal tenderness.  Musculoskeletal:        General: No swelling.       Arms:     Cervical back: Neck supple.  Skin:    General: Skin is warm and dry.     Capillary Refill: Capillary refill takes less than 2 seconds.  Neurological:     Mental Status: She is alert.  Psychiatric:        Mood and Affect: Mood normal.     (all labs ordered are listed, but only abnormal results are displayed) Labs Reviewed - No data to display  EKG: None  Radiology: DG Shoulder Left Result Date: 10/14/2024 EXAM: 1 VIEW(S) XRAY OF THE LEFT SHOULDER 10/14/2024 08:11:00 AM COMPARISON: None available. CLINICAL HISTORY: Blunt trauma. FINDINGS: BONES AND JOINTS: Glenohumeral joint is normally aligned. No acute fracture. No malalignment. The [facility] joint is unremarkable. SOFT TISSUES: No abnormal calcifications. Visualized lung is unremarkable. IMPRESSION: 1. No acute fracture or dislocation. Electronically signed by: Waddell Calk MD 10/14/2024 08:26 AM EST RP Workstation: [id number]   DG Elbow Complete Left Result Date: 10/14/2024 EXAM: 3 VIEW(S) XRAY OF THE LEFT ELBOW COMPARISON: None available. CLINICAL HISTORY: Blunt trauma. FINDINGS: BONES AND JOINTS: No acute fracture. No malalignment. SOFT TISSUES: Unremarkable. IMPRESSION: 1. No acute fracture or dislocation. Electronically signed by: Waddell Calk MD 10/14/2024 08:25 AM EST RP Workstation: [id number]   DG Ribs Unilateral W/Chest Left Result Date: 10/14/2024 EXAM: 1 VIEW(S) XRAY OF THE LEFT RIBS AND CHEST 10/14/2024 08:11:00 AM COMPARISON: Chest radiograph 10/16/2022. CLINICAL HISTORY: Blunt trauma. FINDINGS: BONES: No acute displaced rib fracture. Fixation screws in right glenoid. LUNGS AND PLEURA: No consolidation or pulmonary edema. No  pleural effusion or pneumothorax. HEART AND MEDIASTINUM: No acute abnormality of the cardiac and mediastinal silhouettes. IMPRESSION: 1. No acute left rib fracture. Electronically signed by: Waddell Calk MD 10/14/2024 08:24 AM EST RP Workstation: [id number]   CT Cervical Spine Wo Contrast Result Date: 10/14/2024 EXAM: CT CERVICAL SPINE WITHOUT CONTRAST 10/14/2024 08:02:03 AM TECHNIQUE: CT of the cervical spine was performed without the administration of intravenous contrast. Multiplanar reformatted images are provided for review. Automated exposure control, iterative reconstruction, and/or weight based adjustment of the mA/kV was utilized to reduce the radiation dose to as low as reasonably achievable. COMPARISON: None available. CLINICAL HISTORY: blunt trauma Blunt trauma. FINDINGS: BONES AND ALIGNMENT: No acute fracture or traumatic malalignment. DEGENERATIVE CHANGES: No significant degenerative changes. SOFT TISSUES: No prevertebral soft tissue swelling. IMPRESSION: 1. No evidence of  acute traumatic injury. Electronically signed by: Donnice Mania MD 10/14/2024 08:24 AM EST RP Workstation: [id number]     Procedures   Medications Ordered in the ED  lidocaine  (LIDODERM ) 5 % 1 patch (1 patch Transdermal Patch Applied 10/14/24 0837)  acetaminophen  (TYLENOL ) tablet 1,000 mg (1,000 mg Oral Given 10/14/24 0737)                                    Medical Decision Making Amount and/or Complexity of Data Reviewed Radiology: ordered.  Risk OTC drugs. Prescription drug management.     HPI:   Presents  mechanical fall.  Slipped on ice.  Fell onto the left side.  Did not hit her head.  No loss of consciousness.  No headache or vision changes.  No anticoagulation use.  Endorses mostly left-sided pain.  Some left-sided neck pain.  Also endorsing some left shoulder pain.  Left elbow pain.  Left rib pain.  No chest pain or shortness of breath.  Once again, did not lose consciousness and this was  mechanical fall.  Otherwise, denies all complaints.  No numbness or tingling aware.  Been able to ambulate.  Has not taken thing for pain.  Previous medical history reviewed : Patient last seen in ED on September 24, 2024 in the setting of pelvic pain.  Normal workup.  MDM:   Upon examination, patient hemodynamically stable. A&O x 3 with GCS 15.   Patient has pain to palpation left side of the neck.  No significant central tenderness but borderline paravertebral.  Given the mechanism, will obtain CT cervical spine rule out an Kuneff cervical vertebral injury though I think this is unlikely.  No concerns for spinal cord injury such as surgical or syndrome given patient is neurologically intact with equal grip strength bilaterally and station intact in all dermatomes.  Also obtain x-ray of the left shoulder and left elbow to rule out any kind of obvious fracture.  No concerns of dislocation based off exam.  Obtain x-ray of the left ribs as well as chest x-ray to rule out any kind of obvious rib fracture and/or pneumothorax.  She does have lungs clear to auscultation bilaterally.  Mechanical.  No concerns of syncope.  No indication for EKG or labs.  Reevaluation:   Upon reexamination, patient hemodynamically stable.  Remains A&O x 3 with GCS 15.   X-ray imaging of the left shoulder as well as left elbow are unremarkable.  X-ray imaging of the chest unremarkable.  No pneumothorax or obvious rib fractures.  Recommended supportive care.  Tylenol  and ibuprofen  at home.  Called in lidocaine  patches as well.  No acute interventions needed at this time.    I have independently interpreted the CXR   images and agree with the radiologist finding   Social Determinant of Health: denies drugs/alcohol    Disposition and Follow Up: pcp      Final diagnoses:  Rib pain  Acute pain of left shoulder  Fall, initial encounter    ED Discharge Orders          Ordered    lidocaine  (LIDODERM ) 5 %   Every 24 hours        10/14/24 0841               Simon Lavonia SAILOR, MD 10/14/24 [phone]  "

## 2024-10-14 NOTE — ED Notes (Signed)
 Patient transported to CT

## 2024-10-14 NOTE — Discharge Instructions (Addendum)
 For pain, you can take 1000 mg of Tylenol  or 1 g of Tylenol  every 6-8 hours.  Do not exceed more than 4000 mg or 4 g in a 24-hour period.  You can also take ibuprofen  600 to 800 mg every 6-8 hours as well.  Do not take this high-dose ibuprofen  for greater than a week.  I have also called in a lidocaine  patch.   If you have any kind of worsening pain or shortness of breath then please come back to the ED.  Follow-up with your primary care physician.

## 2024-10-14 NOTE — ED Notes (Signed)
 ED Provider at bedside.
